# Patient Record
Sex: Female | Born: 1935 | Race: White | Hispanic: No | Smoking: Never smoker
Health system: Southern US, Community
[De-identification: ages and names within clinical notes are randomized; demographics above are authoritative.]

## PROBLEM LIST (undated history)

## (undated) DIAGNOSIS — K279 Peptic ulcer, site unspecified, unspecified as acute or chronic, without hemorrhage or perforation: Secondary | ICD-10-CM

## (undated) DIAGNOSIS — F0390 Unspecified dementia without behavioral disturbance: Secondary | ICD-10-CM

## (undated) DIAGNOSIS — I509 Heart failure, unspecified: Secondary | ICD-10-CM

## (undated) DIAGNOSIS — U071 COVID-19: Secondary | ICD-10-CM

## (undated) DIAGNOSIS — I10 Essential (primary) hypertension: Secondary | ICD-10-CM

## (undated) DIAGNOSIS — Z9861 Coronary angioplasty status: Secondary | ICD-10-CM

## (undated) DIAGNOSIS — I2510 Atherosclerotic heart disease of native coronary artery without angina pectoris: Secondary | ICD-10-CM

## (undated) DIAGNOSIS — E119 Type 2 diabetes mellitus without complications: Secondary | ICD-10-CM

## (undated) DIAGNOSIS — E785 Hyperlipidemia, unspecified: Secondary | ICD-10-CM

---

## 2019-10-27 ENCOUNTER — Inpatient Hospital Stay (HOSPITAL_COMMUNITY): Payer: Medicare HMO

## 2019-10-27 ENCOUNTER — Other Ambulatory Visit: Payer: Self-pay

## 2019-10-27 ENCOUNTER — Inpatient Hospital Stay (HOSPITAL_COMMUNITY)
Admission: EM | Admit: 2019-10-27 | Discharge: 2019-10-31 | DRG: 871 | Disposition: A | Discharge status: Skilled Nursing Facility | Payer: Medicare HMO | Source: Skilled Nursing Facility | Attending: Internal Medicine | Admitting: Internal Medicine

## 2019-10-27 ENCOUNTER — Emergency Department (HOSPITAL_COMMUNITY): Payer: Medicare HMO

## 2019-10-27 ENCOUNTER — Encounter (HOSPITAL_COMMUNITY): Payer: Self-pay

## 2019-10-27 DIAGNOSIS — R0602 Shortness of breath: Secondary | ICD-10-CM | POA: Diagnosis not present

## 2019-10-27 DIAGNOSIS — Z888 Allergy status to other drugs, medicaments and biological substances status: Secondary | ICD-10-CM

## 2019-10-27 DIAGNOSIS — J440 Chronic obstructive pulmonary disease with acute lower respiratory infection: Secondary | ICD-10-CM | POA: Diagnosis present

## 2019-10-27 DIAGNOSIS — E785 Hyperlipidemia, unspecified: Secondary | ICD-10-CM | POA: Diagnosis present

## 2019-10-27 DIAGNOSIS — I130 Hypertensive heart and chronic kidney disease with heart failure and stage 1 through stage 4 chronic kidney disease, or unspecified chronic kidney disease: Secondary | ICD-10-CM | POA: Diagnosis present

## 2019-10-27 DIAGNOSIS — J9601 Acute respiratory failure with hypoxia: Secondary | ICD-10-CM | POA: Diagnosis present

## 2019-10-27 DIAGNOSIS — Z7901 Long term (current) use of anticoagulants: Secondary | ICD-10-CM | POA: Diagnosis not present

## 2019-10-27 DIAGNOSIS — U071 COVID-19: Secondary | ICD-10-CM | POA: Diagnosis present

## 2019-10-27 DIAGNOSIS — J159 Unspecified bacterial pneumonia: Secondary | ICD-10-CM | POA: Diagnosis present

## 2019-10-27 DIAGNOSIS — F0390 Unspecified dementia without behavioral disturbance: Secondary | ICD-10-CM | POA: Diagnosis present

## 2019-10-27 DIAGNOSIS — E1165 Type 2 diabetes mellitus with hyperglycemia: Secondary | ICD-10-CM | POA: Diagnosis present

## 2019-10-27 DIAGNOSIS — Z7982 Long term (current) use of aspirin: Secondary | ICD-10-CM | POA: Diagnosis not present

## 2019-10-27 DIAGNOSIS — I10 Essential (primary) hypertension: Secondary | ICD-10-CM | POA: Diagnosis not present

## 2019-10-27 DIAGNOSIS — I5043 Acute on chronic combined systolic (congestive) and diastolic (congestive) heart failure: Secondary | ICD-10-CM

## 2019-10-27 DIAGNOSIS — Z79899 Other long term (current) drug therapy: Secondary | ICD-10-CM

## 2019-10-27 DIAGNOSIS — I959 Hypotension, unspecified: Secondary | ICD-10-CM | POA: Diagnosis present

## 2019-10-27 DIAGNOSIS — Z880 Allergy status to penicillin: Secondary | ICD-10-CM | POA: Diagnosis not present

## 2019-10-27 DIAGNOSIS — A4150 Gram-negative sepsis, unspecified: Secondary | ICD-10-CM | POA: Diagnosis not present

## 2019-10-27 DIAGNOSIS — Z8711 Personal history of peptic ulcer disease: Secondary | ICD-10-CM

## 2019-10-27 DIAGNOSIS — N184 Chronic kidney disease, stage 4 (severe): Secondary | ICD-10-CM | POA: Diagnosis present

## 2019-10-27 DIAGNOSIS — J1282 Pneumonia due to coronavirus disease 2019: Secondary | ICD-10-CM | POA: Diagnosis present

## 2019-10-27 DIAGNOSIS — E1159 Type 2 diabetes mellitus with other circulatory complications: Secondary | ICD-10-CM | POA: Diagnosis present

## 2019-10-27 DIAGNOSIS — I2510 Atherosclerotic heart disease of native coronary artery without angina pectoris: Secondary | ICD-10-CM | POA: Diagnosis present

## 2019-10-27 DIAGNOSIS — I4510 Unspecified right bundle-branch block: Secondary | ICD-10-CM | POA: Diagnosis present

## 2019-10-27 DIAGNOSIS — J189 Pneumonia, unspecified organism: Secondary | ICD-10-CM | POA: Diagnosis not present

## 2019-10-27 DIAGNOSIS — E1122 Type 2 diabetes mellitus with diabetic chronic kidney disease: Secondary | ICD-10-CM | POA: Diagnosis present

## 2019-10-27 DIAGNOSIS — Y95 Nosocomial condition: Secondary | ICD-10-CM | POA: Diagnosis not present

## 2019-10-27 DIAGNOSIS — Z66 Do not resuscitate: Secondary | ICD-10-CM | POA: Diagnosis present

## 2019-10-27 DIAGNOSIS — N179 Acute kidney failure, unspecified: Secondary | ICD-10-CM

## 2019-10-27 DIAGNOSIS — I152 Hypertension secondary to endocrine disorders: Secondary | ICD-10-CM | POA: Diagnosis present

## 2019-10-27 DIAGNOSIS — Z9861 Coronary angioplasty status: Secondary | ICD-10-CM

## 2019-10-27 DIAGNOSIS — E119 Type 2 diabetes mellitus without complications: Secondary | ICD-10-CM | POA: Diagnosis present

## 2019-10-27 DIAGNOSIS — R7881 Bacteremia: Secondary | ICD-10-CM | POA: Diagnosis not present

## 2019-10-27 HISTORY — DX: COVID-19: U07.1

## 2019-10-27 HISTORY — DX: Hyperlipidemia, unspecified: E78.5

## 2019-10-27 HISTORY — DX: Coronary angioplasty status: Z98.61

## 2019-10-27 HISTORY — DX: Unspecified dementia, unspecified severity, without behavioral disturbance, psychotic disturbance, mood disturbance, and anxiety: F03.90

## 2019-10-27 HISTORY — DX: Type 2 diabetes mellitus without complications: E11.9

## 2019-10-27 HISTORY — DX: Heart failure, unspecified: I50.9

## 2019-10-27 HISTORY — DX: Essential (primary) hypertension: I10

## 2019-10-27 HISTORY — DX: Peptic ulcer, site unspecified, unspecified as acute or chronic, without hemorrhage or perforation: K27.9

## 2019-10-27 HISTORY — DX: Atherosclerotic heart disease of native coronary artery without angina pectoris: I25.10

## 2019-10-27 LAB — RESPIRATORY PANEL BY RT PCR (FLU A&B, COVID)
Influenza A by PCR: NEGATIVE
Influenza B by PCR: NEGATIVE
SARS Coronavirus 2 by RT PCR: POSITIVE — AB

## 2019-10-27 LAB — COMPREHENSIVE METABOLIC PANEL
ALT: 20 U/L (ref 0–44)
AST: 21 U/L (ref 15–41)
Albumin: 2.3 g/dL — ABNORMAL LOW (ref 3.5–5.0)
Alkaline Phosphatase: 77 U/L (ref 38–126)
Anion gap: 13 (ref 5–15)
BUN: 72 mg/dL — ABNORMAL HIGH (ref 8–23)
CO2: 22 mmol/L (ref 22–32)
Calcium: 9.5 mg/dL (ref 8.9–10.3)
Chloride: 102 mmol/L (ref 98–111)
Creatinine, Ser: 3.05 mg/dL — ABNORMAL HIGH (ref 0.44–1.00)
GFR calc Af Amer: 15 mL/min — ABNORMAL LOW (ref >60)
GFR calc non Af Amer: 13 mL/min — ABNORMAL LOW (ref >60)
Glucose, Bld: 376 mg/dL — ABNORMAL HIGH (ref 70–99)
Potassium: 4.8 mmol/L (ref 3.5–5.1)
Sodium: 137 mmol/L (ref 135–145)
Total Bilirubin: 0.9 mg/dL (ref 0.3–1.2)
Total Protein: 5.1 g/dL — ABNORMAL LOW (ref 6.5–8.1)

## 2019-10-27 LAB — TROPONIN I (HIGH SENSITIVITY)
Troponin I (High Sensitivity): 58 ng/L — ABNORMAL HIGH (ref <18)
Troponin I (High Sensitivity): 53 ng/L — ABNORMAL HIGH (ref <18)

## 2019-10-27 LAB — CBC WITH DIFFERENTIAL/PLATELET
Abs Immature Granulocytes: 0 10*3/uL (ref 0.00–0.07)
Basophils Absolute: 0 10*3/uL (ref 0.0–0.1)
Basophils Relative: 0 %
Eosinophils Absolute: 0 10*3/uL (ref 0.0–0.5)
Eosinophils Relative: 0 %
HCT: 39.6 % (ref 36.0–46.0)
Hemoglobin: 12.7 g/dL (ref 12.0–15.0)
Lymphocytes Relative: 1 %
Lymphs Abs: 0.5 10*3/uL — ABNORMAL LOW (ref 0.7–4.0)
MCH: 31.1 pg (ref 26.0–34.0)
MCHC: 32.1 g/dL (ref 30.0–36.0)
MCV: 97.1 fL (ref 80.0–100.0)
Monocytes Absolute: 1.1 10*3/uL — ABNORMAL HIGH (ref 0.1–1.0)
Monocytes Relative: 2 %
Neutro Abs: 51.9 10*3/uL — ABNORMAL HIGH (ref 1.7–7.7)
Neutrophils Relative %: 97 %
Platelets: 333 10*3/uL (ref 150–400)
RBC: 4.08 MIL/uL (ref 3.87–5.11)
RDW: 13 % (ref 11.5–15.5)
WBC: 53.5 10*3/uL (ref 4.0–10.5)
nRBC: 0 % (ref 0.0–0.2)
nRBC: 0 /100 WBC

## 2019-10-27 LAB — LACTATE DEHYDROGENASE: LDH: 218 U/L — ABNORMAL HIGH (ref 98–192)

## 2019-10-27 LAB — ECHOCARDIOGRAM LIMITED
Height: 66 in
Weight: 2816 oz

## 2019-10-27 LAB — CBG MONITORING, ED
Glucose-Capillary: 377 mg/dL — ABNORMAL HIGH (ref 70–99)
Glucose-Capillary: 245 mg/dL — ABNORMAL HIGH (ref 70–99)
Glucose-Capillary: 117 mg/dL — ABNORMAL HIGH (ref 70–99)

## 2019-10-27 LAB — TRIGLYCERIDES: Triglycerides: 311 mg/dL — ABNORMAL HIGH (ref <150)

## 2019-10-27 LAB — PROCALCITONIN: Procalcitonin: 68.85 ng/mL

## 2019-10-27 LAB — FERRITIN: Ferritin: 375 ng/mL — ABNORMAL HIGH (ref 11–307)

## 2019-10-27 LAB — ABO/RH: ABO/RH(D): A POS

## 2019-10-27 LAB — FIBRINOGEN: Fibrinogen: 395 mg/dL (ref 210–475)

## 2019-10-27 LAB — PROTIME-INR
INR: 1.6 — ABNORMAL HIGH (ref 0.8–1.2)
Prothrombin Time: 19.1 seconds — ABNORMAL HIGH (ref 11.4–15.2)

## 2019-10-27 LAB — APTT: aPTT: 31 seconds (ref 24–36)

## 2019-10-27 LAB — C-REACTIVE PROTEIN: CRP: 14.1 mg/dL — ABNORMAL HIGH (ref <1.0)

## 2019-10-27 LAB — LACTIC ACID, PLASMA
Lactic Acid, Venous: 5.5 mmol/L (ref 0.5–1.9)
Lactic Acid, Venous: 4.5 mmol/L (ref 0.5–1.9)

## 2019-10-27 LAB — D-DIMER, QUANTITATIVE: D-Dimer, Quant: 1.69 ug/mL-FEU — ABNORMAL HIGH (ref 0.00–0.50)

## 2019-10-27 MED ORDER — SODIUM CHLORIDE 0.9 % IV BOLUS (SEPSIS)
500.0000 mL | Freq: Once | INTRAVENOUS | Status: AC
  Administered 2019-10-27: 500 mL via INTRAVENOUS

## 2019-10-27 MED ORDER — SODIUM CHLORIDE 0.9 % IV SOLN
200.0000 mg | Freq: Once | INTRAVENOUS | Status: AC
  Administered 2019-10-27: 17:00:00 200 mg via INTRAVENOUS
  Filled 2019-10-27: qty 40

## 2019-10-27 MED ORDER — MIRTAZAPINE 15 MG PO TABS
7.5000 mg | ORAL_TABLET | Freq: Every day | ORAL | Status: DC
  Administered 2019-10-27 – 2019-10-30 (×4): 7.5 mg via ORAL
  Filled 2019-10-27 (×5): qty 1

## 2019-10-27 MED ORDER — VANCOMYCIN HCL 1500 MG/300ML IV SOLN
1500.0000 mg | Freq: Once | INTRAVENOUS | Status: AC
  Administered 2019-10-27: 1500 mg via INTRAVENOUS
  Filled 2019-10-27: qty 300

## 2019-10-27 MED ORDER — SODIUM CHLORIDE 0.45 % IV SOLN: INTRAVENOUS | Status: DC

## 2019-10-27 MED ORDER — DOCUSATE SODIUM 100 MG PO CAPS
100.0000 mg | ORAL_CAPSULE | Freq: Two times a day (BID) | ORAL | Status: DC
  Administered 2019-10-27 – 2019-10-30 (×5): 100 mg via ORAL
  Filled 2019-10-27 (×6): qty 1

## 2019-10-27 MED ORDER — TECHNETIUM TO 99M ALBUMIN AGGREGATED
1.5400 | Freq: Once | INTRAVENOUS | Status: AC | PRN
  Administered 2019-10-27: 16:00:00 1.54 via INTRAVENOUS

## 2019-10-27 MED ORDER — AMLODIPINE BESYLATE 5 MG PO TABS
2.5000 mg | ORAL_TABLET | Freq: Every day | ORAL | Status: DC
  Administered 2019-10-28: 09:00:00 2.5 mg via ORAL
  Filled 2019-10-27 (×2): qty 1

## 2019-10-27 MED ORDER — ACETAMINOPHEN 325 MG PO TABS
650.0000 mg | ORAL_TABLET | Freq: Four times a day (QID) | ORAL | Status: DC | PRN
  Administered 2019-10-28: 650 mg via ORAL
  Filled 2019-10-27: qty 2

## 2019-10-27 MED ORDER — CARVEDILOL 12.5 MG PO TABS: 12.5000 mg | ORAL_TABLET | Freq: Two times a day (BID) | ORAL | Status: DC

## 2019-10-27 MED ORDER — HEPARIN SODIUM (PORCINE) 5000 UNIT/ML IJ SOLN
5000.0000 [IU] | Freq: Three times a day (TID) | INTRAMUSCULAR | Status: DC
  Administered 2019-10-27 – 2019-10-29 (×6): 5000 [IU] via SUBCUTANEOUS
  Filled 2019-10-27 (×5): qty 1

## 2019-10-27 MED ORDER — SODIUM CHLORIDE 0.9 % IV BOLUS (SEPSIS)
250.0000 mL | Freq: Once | INTRAVENOUS | Status: AC
  Administered 2019-10-27: 250 mL via INTRAVENOUS

## 2019-10-27 MED ORDER — INSULIN DETEMIR 100 UNIT/ML ~~LOC~~ SOLN
0.0750 [IU]/kg | Freq: Two times a day (BID) | SUBCUTANEOUS | Status: DC
  Administered 2019-10-27 – 2019-10-31 (×8): 6 [IU] via SUBCUTANEOUS
  Filled 2019-10-27 (×13): qty 0.06

## 2019-10-27 MED ORDER — ENOXAPARIN SODIUM 30 MG/0.3ML ~~LOC~~ SOLN: 30.0000 mg | SUBCUTANEOUS | Status: DC

## 2019-10-27 MED ORDER — FESOTERODINE FUMARATE ER 4 MG PO TB24
4.0000 mg | ORAL_TABLET | Freq: Every day | ORAL | Status: DC
  Administered 2019-10-28 – 2019-10-31 (×4): 4 mg via ORAL
  Filled 2019-10-27 (×5): qty 1

## 2019-10-27 MED ORDER — ENOXAPARIN SODIUM 30 MG/0.3ML ~~LOC~~ SOLN: 30.0000 mg | Freq: Once | SUBCUTANEOUS | Status: DC

## 2019-10-27 MED ORDER — DEXAMETHASONE 6 MG PO TABS
6.0000 mg | ORAL_TABLET | ORAL | Status: DC
  Administered 2019-10-27 – 2019-10-30 (×4): 6 mg via ORAL
  Filled 2019-10-27 (×2): qty 1
  Filled 2019-10-27: qty 2
  Filled 2019-10-27: qty 1

## 2019-10-27 MED ORDER — SODIUM CHLORIDE 0.9 % IV BOLUS
500.0000 mL | Freq: Once | INTRAVENOUS | Status: AC
  Administered 2019-10-27: 500 mL via INTRAVENOUS

## 2019-10-27 MED ORDER — SODIUM CHLORIDE 0.9 % IV SOLN
100.0000 mg | Freq: Every day | INTRAVENOUS | Status: AC
  Administered 2019-10-28 – 2019-10-31 (×4): 100 mg via INTRAVENOUS
  Filled 2019-10-27 (×6): qty 20

## 2019-10-27 MED ORDER — HALOPERIDOL 1 MG PO TABS
1.0000 mg | ORAL_TABLET | Freq: Three times a day (TID) | ORAL | Status: DC
  Administered 2019-10-27 – 2019-10-31 (×11): 1 mg via ORAL
  Filled 2019-10-27 (×15): qty 1

## 2019-10-27 MED ORDER — SODIUM CHLORIDE 0.9 % IV BOLUS (SEPSIS)
1000.0000 mL | Freq: Once | INTRAVENOUS | Status: AC
  Administered 2019-10-27: 11:00:00 1000 mL via INTRAVENOUS

## 2019-10-27 MED ORDER — INSULIN ASPART 100 UNIT/ML ~~LOC~~ SOLN
0.0000 [IU] | SUBCUTANEOUS | Status: DC
  Administered 2019-10-27: 16:00:00 7 [IU] via SUBCUTANEOUS
  Administered 2019-10-27: 3 [IU] via SUBCUTANEOUS
  Administered 2019-10-28 (×2): 4 [IU] via SUBCUTANEOUS
  Administered 2019-10-28 (×2): 3 [IU] via SUBCUTANEOUS
  Administered 2019-10-28: 13:00:00 15 [IU] via SUBCUTANEOUS
  Administered 2019-10-29: 3 [IU] via SUBCUTANEOUS
  Administered 2019-10-29: 4 [IU] via SUBCUTANEOUS
  Administered 2019-10-29: 7 [IU] via SUBCUTANEOUS
  Administered 2019-10-29: 11 [IU] via SUBCUTANEOUS
  Administered 2019-10-29 (×2): 4 [IU] via SUBCUTANEOUS
  Administered 2019-10-30: 15 [IU] via SUBCUTANEOUS
  Administered 2019-10-30 (×2): 7 [IU] via SUBCUTANEOUS
  Administered 2019-10-30: 01:00:00 4 [IU] via SUBCUTANEOUS
  Administered 2019-10-30: 05:00:00 3 [IU] via SUBCUTANEOUS
  Administered 2019-10-30: 4 [IU] via SUBCUTANEOUS
  Administered 2019-10-31: 3 [IU] via SUBCUTANEOUS

## 2019-10-27 MED ORDER — ACETAMINOPHEN 650 MG RE SUPP
650.0000 mg | Freq: Once | RECTAL | Status: AC
  Administered 2019-10-27: 650 mg via RECTAL
  Filled 2019-10-27: qty 1

## 2019-10-27 MED ORDER — SODIUM CHLORIDE 0.9 % IV SOLN
500.0000 mg | Freq: Two times a day (BID) | INTRAVENOUS | Status: DC
  Administered 2019-10-27 – 2019-10-28 (×2): 500 mg via INTRAVENOUS
  Filled 2019-10-27 (×4): qty 0.5

## 2019-10-27 MED ORDER — VANCOMYCIN VARIABLE DOSE PER UNSTABLE RENAL FUNCTION (PHARMACIST DOSING): Status: DC

## 2019-10-27 MED ORDER — LEVOFLOXACIN IN D5W 500 MG/100ML IV SOLN: 500.0000 mg | INTRAVENOUS | Status: DC

## 2019-10-27 MED ORDER — FUROSEMIDE 20 MG PO TABS
20.0000 mg | ORAL_TABLET | Freq: Every day | ORAL | Status: DC
  Administered 2019-10-27: 20 mg via ORAL
  Filled 2019-10-27: qty 1

## 2019-10-27 MED ORDER — LEVOFLOXACIN IN D5W 750 MG/150ML IV SOLN
750.0000 mg | Freq: Once | INTRAVENOUS | Status: AC
  Administered 2019-10-27: 750 mg via INTRAVENOUS
  Filled 2019-10-27: qty 150

## 2019-10-27 NOTE — ED Notes (Signed)
[facility] from [facility]

## 2019-10-27 NOTE — ED Provider Notes (Signed)
[facility] EMERGENCY DEPARTMENT Provider Note   CSN: [id number] Arrival date & time: 10/27/19  1008     History Chief Complaint  Patient presents with  . Shortness of Breath  . Hypotension    Chloe Ramsey is a 86 y.o. female.  HPI   86 year old female with a h/o CAD, CHF, dementia, HLD, HTN, PUD, presenting for evaluation via EMS from [facility] for evaluation of hypoxia, hypotension and hyperglycemia.  Per facility, patient was diagnosed with the coronavirus on 12/23.  She had not been requiring oxygen up until today.  Per facility she had sats in the upper 60s, low 70s on 5 L O2.  Upon EMS arrival she was placed on the nonrebreather and sats improved to the 90s. She was given 700cc IVF en route.  Level 5 caveat due to patient h/o dementia and acuity of condition.   11:39 AM Discussed case with supervisor from [facility] who states that patient does have h/o dementia and can normally talk and state what her name is. Further states that pt can normally walk on her own. She has intermittently been on oxygen. Today her sats dropped to 76% this AM. Her BP was 91/51. Denies that pt has had any recent diarrhea, vomiting.  Past Medical History:  Diagnosis Date  . CAD (coronary artery disease)   . CHF (congestive heart failure) ([place])   . COVID-19   . Dementia ([place])   . Diabetes mellitus without complication ([place])   . H/O coronary angioplasty   . Hyperlipidemia   . Hypertension   . Peptic ulcer     Patient Active Problem List   Diagnosis Date Noted  . Type 2 diabetes mellitus with stage 4 chronic kidney disease ([place]) 10/27/2019  . Hypertension complicating diabetes ([place]) 10/27/2019  . Acute on chronic combined systolic and diastolic CHF (congestive heart failure) ([place]) 10/27/2019  . Pneumonia due to COVID-19 virus 10/27/2019  . HAP (hospital-acquired pneumonia) 10/27/2019    History reviewed. No pertinent surgical history.   OB History   No obstetric  history on file.     History reviewed. No pertinent family history.  Social History   Tobacco Use  . Smoking status: Unknown If Ever Smoked  . Smokeless tobacco: Never Used  Substance Use Topics  . Alcohol use: Not Currently  . Drug use: Not Currently    Home Medications Prior to Admission medications   Medication Sig Start Date End Date Taking? Authorizing Provider  acetaminophen (TYLENOL) 500 MG tablet Take 1,000 mg by mouth daily.   Yes [provider]  amLODipine (NORVASC) 2.5 MG tablet Take 2.5 mg by mouth daily.   Yes [provider]  apixaban (ELIQUIS) 2.5 MG TABS tablet Take 2.5 mg by mouth 2 (two) times daily. For 21 days, started 10-10-19   Yes [provider]  aspirin 81 MG chewable tablet Chew 81 mg by mouth daily.   Yes [provider]  atorvastatin (LIPITOR) 40 MG tablet Take 40 mg by mouth at bedtime.   Yes [provider]  calcium citrate (CALCITRATE - DOSED IN MG ELEMENTAL CALCIUM) 950 (200 Ca) MG tablet Take 200 mg of elemental calcium by mouth daily.   Yes [provider]  carvedilol (COREG) 12.5 MG tablet Take 12.5 mg by mouth 2 (two) times daily with a meal.   Yes [provider]  Cholecalciferol (VITAMIN D) 50 MCG (2000 UT) CAPS Take 2,000 Units by mouth daily.   Yes [provider]  donepezil (ARICEPT) 10 MG tablet Take 10 mg by mouth at bedtime.   Yes [provider]  furosemide (LASIX) 20 MG tablet Take 20 mg by mouth daily.   Yes [provider]  haloperidol (HALDOL) 1 MG tablet Take 1 mg by mouth 3 (three) times daily.   Yes [provider]  hydrochlorothiazide (MICROZIDE) 12.5 MG capsule Take 12.5 mg by mouth daily.   Yes [provider]  mirtazapine (REMERON) 7.5 MG tablet Take 7.5 mg by mouth at bedtime.   Yes [provider]  tolterodine (DETROL LA) 4 MG 24 hr capsule Take 4 mg by mouth daily.   Yes [provider]  valsartan  (DIOVAN) 320 MG tablet Take 320 mg by mouth daily.   Yes [provider]    Allergies    Lopid [gemfibrozil] and Penicillins  Review of Systems   Review of Systems  Unable to perform ROS: Dementia    Physical Exam Updated Vital Signs BP (!) 95/44   Pulse 90   Temp (!) 100.7 F (38.2 C) (Rectal)   Resp (!) 39   Ht 5\' 6"  (1.676 m)   Wt 79.8 kg   LMP  (LMP Unknown)   SpO2 99%   BMI 28.41 kg/m   Physical Exam Vitals and nursing note reviewed.  Constitutional:      General: She is not in acute distress.    Appearance: She is well-developed. She is ill-appearing.  HENT:     Head: Normocephalic and atraumatic.     Mouth/Throat:     Mouth: Mucous membranes are dry.  Eyes:     Conjunctiva/sclera: Conjunctivae normal.  Cardiovascular:     Rate and Rhythm: Normal rate and regular rhythm.     Heart sounds: No murmur.  Pulmonary:     Effort: Pulmonary effort is normal. No respiratory distress.     Breath sounds: Examination of the right-upper field reveals rales. Examination of the left-upper field reveals rales. Examination of the right-middle field reveals rales. Examination of the left-middle field reveals rales. Examination of the right-lower field reveals rales. Examination of the left-lower field reveals rales. Rales present.  Abdominal:     General: Bowel sounds are normal.     Palpations: Abdomen is soft.     Tenderness: There is no abdominal tenderness. There is no guarding or rebound.  Musculoskeletal:     Cervical back: Neck supple.     Right lower leg: No tenderness. No edema.     Left lower leg: No tenderness. No edema.  Skin:    General: Skin is warm and dry.  Neurological:     Mental Status: She is alert.     ED Results / Procedures / Treatments   Labs (all labs ordered are listed, but only abnormal results are displayed) Labs Reviewed  RESPIRATORY PANEL BY RT PCR (FLU A&B, COVID) - Abnormal; Notable for the following components:      Result  Value   SARS Coronavirus 2 by RT PCR POSITIVE (*)    All other components within normal limits  LACTIC ACID, PLASMA - Abnormal; Notable for the following components:   Lactic Acid, Venous 5.5 (*)    All other components within normal limits  LACTIC ACID, PLASMA - Abnormal; Notable for the following components:   Lactic Acid, Venous 4.5 (*)    All other components within normal limits  CBC WITH DIFFERENTIAL/PLATELET - Abnormal; Notable for the following components:   WBC 53.5 (*)  Neutro Abs 51.9 (*)    Lymphs Abs 0.5 (*)    Monocytes Absolute 1.1 (*)    All other components within normal limits  COMPREHENSIVE METABOLIC PANEL - Abnormal; Notable for the following components:   Glucose, Bld 376 (*)    BUN 72 (*)    Creatinine, Ser 3.05 (*)    Total Protein 5.1 (*)    Albumin 2.3 (*)    GFR calc non Af Amer 13 (*)    GFR calc Af Amer 15 (*)    All other components within normal limits  D-DIMER, QUANTITATIVE (NOT AT [facility]) - Abnormal; Notable for the following components:   D-Dimer, Quant 1.69 (*)    All other components within normal limits  LACTATE DEHYDROGENASE - Abnormal; Notable for the following components:   LDH 218 (*)    All other components within normal limits  FERRITIN - Abnormal; Notable for the following components:   Ferritin 375 (*)    All other components within normal limits  TRIGLYCERIDES - Abnormal; Notable for the following components:   Triglycerides 311 (*)    All other components within normal limits  C-REACTIVE PROTEIN - Abnormal; Notable for the following components:   CRP 14.1 (*)    All other components within normal limits  PROTIME-INR - Abnormal; Notable for the following components:   Prothrombin Time 19.1 (*)    INR 1.6 (*)    All other components within normal limits  CBG MONITORING, ED - Abnormal; Notable for the following components:   Glucose-Capillary 377 (*)    All other components within normal limits  TROPONIN I (HIGH SENSITIVITY) -  Abnormal; Notable for the following components:   Troponin I (High Sensitivity) 58 (*)    All other components within normal limits  TROPONIN I (HIGH SENSITIVITY) - Abnormal; Notable for the following components:   Troponin I (High Sensitivity) 53 (*)    All other components within normal limits  CULTURE, BLOOD (ROUTINE X 2)  CULTURE, BLOOD (ROUTINE X 2)  URINE CULTURE  PROCALCITONIN  FIBRINOGEN  APTT  URINALYSIS, ROUTINE W REFLEX MICROSCOPIC  POC SARS CORONAVIRUS 2 AG -  ED    EKG EKG Interpretation  Date/Time:  Tuesday October 27 2019 13:15:05 EST Ventricular Rate:  98 PR Interval:    QRS Duration: 139 QT Interval:  432 QTC Calculation: 505 R Axis:   19 Text Interpretation: Sinus rhythm Ventricular premature complex Consider left atrial enlargement Right bundle branch block Confirmed by Veryl Speak [phone]) on 10/27/2019 1:19:18 PM   Radiology DG Chest Port 1 View  Result Date: 10/27/2019 CLINICAL DATA:  COVID positive.  Hypoxia EXAM: PORTABLE CHEST 1 VIEW COMPARISON:  None FINDINGS: Bibasilar infiltrates compatible with pneumonia. Negative for heart failure or effusion. Atherosclerotic aorta. Chronic appearing rib fractures on the right. Degenerative change in the right shoulder. IMPRESSION: Bibasilar infiltrates consistent with pneumonia Electronically Signed   By: Franchot Gallo M.D.   On: 10/27/2019 11:07    Procedures Procedures (including critical care time) CRITICAL CARE Performed by: Rodney Booze   Total critical care time: 45 minutes  Critical care time was exclusive of separately billable procedures and treating other patients.  Critical care was necessary to treat or prevent imminent or life-threatening deterioration.  Critical care was time spent personally by me on the following activities: development of treatment plan with patient and/or surrogate as well as nursing, discussions with consultants, evaluation of patient's response to treatment,  examination of patient, obtaining history from patient  or surrogate, ordering and performing treatments and interventions, ordering and review of laboratory studies, ordering and review of radiographic studies, pulse oximetry and re-evaluation of patient's condition.   Medications Ordered in ED Medications  levofloxacin (LEVAQUIN) IVPB 500 mg (has no administration in time range)  acetaminophen (TYLENOL) suppository 650 mg (has no administration in time range)  enoxaparin (LOVENOX) injection 30 mg (has no administration in time range)  sodium chloride 0.9 % bolus 1,000 mL (0 mLs Intravenous Stopped 10/27/19 1120)    And  sodium chloride 0.9 % bolus 500 mL (0 mLs Intravenous Stopped 10/27/19 1219)    And  sodium chloride 0.9 % bolus 250 mL (0 mLs Intravenous Stopped 10/27/19 1219)  levofloxacin (LEVAQUIN) IVPB 750 mg (0 mg Intravenous Stopped 10/27/19 1317)  sodium chloride 0.9 % bolus 500 mL (500 mLs Intravenous New Bag/Given 10/27/19 1317)    ED Course  I have reviewed the triage vital signs and the nursing notes.  Pertinent labs & imaging results that were available during my care of the patient were reviewed by me and considered in my medical decision making (see chart for details).    MDM Rules/Calculators/A&P                      86 year old female, recently diagnosed with Covid, presenting for evaluation of hypotension, hypoxia, hyperglycemia. Pt DNR with comfort measures, fluids and antibiotics only at bedside   On arrival, patient hypotensive, febrile to 100.7 with increased respiratory rate satting at high 90s to 100% on nonrebreather.  No tachycardia noted.  Based on vital signs and suspected upper respiratory infection, code sepsis initiated and antibiotics initiated for this.  30 cc/kg bolus was ordered. She has already received 700cc fluid bolus with EMS PTA.   CBC with leukocytosis of 53,000 CMP  With elevated blood glucose at 376, normal bicarb.  No elevated anion gap.   BUN/creatinine are elevated at 72 and 3.05. No prior labs for comparison. Lactic elevated at 5.5 Coags with elevated INR 1.6 Inflammatory markers marginally elevated Procalcitonin elevated Trop marginally elevated at 58, suspect secondary to demand UA pending on admissoin Blood cultures obtained  CXR with bibasilar infiltrates consistent with pneumonia   11:19 AM Reassessed pt. BP [id number] systolic. HR 80. RR 24, satting at 100% on NRB.   12:33 PM Had a long discussion with the patients son, Delfino Lovett regarding what interventions the patient would want to have, specifically, if they would want the patient to be initiated on pressors if her BP does not improve. He would like to discuss this with family and have me call him back.  12:56 PM Discussed case with the patients son, who does not with to pursue pressors or ICU admission. He would like to proceed with supportive measures including IVF, abx, and admission to the hospitalist service for further care.    1:48 PM CONSULT with Dr. Linda Hedges with hospitalist service who accepts patient for admission  Final Clinical Impression(s) / ED Diagnoses Final diagnoses:  [id number]    Rx / DC Orders ED Discharge Orders    None       Rodney Booze, PA-C 10/27/19 1416    Veryl Speak, MD 10/29/19 [phone]

## 2019-10-27 NOTE — Progress Notes (Signed)
Pharmacy Antibiotic Note  Chloe Ramsey is a 86 y.o. female admitted on 10/27/2019 with pneumonia and sepsis.  Pharmacy has been consulted for Vancomycin dosing.  Height: 5\' 6"  (167.6 cm) Weight: 176 lb (79.8 kg) IBW/kg (Calculated) : 59.3  Temp (24hrs), Avg:100.7 F (38.2 C), Min:100.7 F (38.2 C), Max:100.7 F (38.2 C)  Recent Labs  Lab 10/27/19 1036 10/27/19 1037 10/27/19 1310  WBC  --  53.5*  --   CREATININE  --  3.05*  --   LATICACIDVEN 5.5*  --  4.5*    Estimated Creatinine Clearance: 13.8 mL/min (A) (by C-G formula based on SCr of 3.05 mg/dL (H)).    Allergies  Allergen Reactions  . Lopid [Gemfibrozil]   . Penicillins     Antimicrobials this admission: 1/12 Levaquin  >> x 1 dose  1/12 Meropenem >>  1/12 Vancomycin  Dose adjustments this admission:   Microbiology results: 1/12 BCx: Pending  Plan: - Vancomycin 1500 mg IV x 1 dose  - D/c Levaquin 2/2 addition of meropenem, renal fxn, and Qtc - With patient's current renal function will dose by levels  - Monitor patient's renal function while on vancomycin   Thank you for allowing pharmacy to be a part of this patient's care.  Duanne Limerick PharmD. BCPS  10/27/2019 4:15 PM

## 2019-10-27 NOTE — ED Notes (Signed)
ED TO INPATIENT HANDOFF REPORT  ED Nurse Name and Phone #: Jori Moll [phone]  S Name/Age/Gender Chloe Ramsey 86 y.o. female Room/Bed: 031C/031C  Code Status   Code Status: DNR  Home/SNF/Other Nursing Home Patient oriented to: self Is this baseline? Yes   Triage Complete: Triage complete  Chief Complaint Pneumonia due to COVID-19 virus [U07.1, J12.82]  Triage Note Per [facility] pt from [facility], they were called for hypoxia, hypotension and hyperglycemic.  Pt usually ambulatory with a walker, was walking around yesterday with no concerns.  Covid + 10/07/2019  On their arrival 5L [place] sats 68-70%. They placed her on NRB sats increased to 90%  BS bilateral Diminished lower with upper Left rhonchi   BP 86/42 HR 90 RR 42 89% 15 L NRB   18 g LAC 700 NS bolus given  Pt DNR with comfort measures, fluids and antibiotics only at bedside     Allergies Allergies  Allergen Reactions  . Lopid [Gemfibrozil]   . Penicillins     Level of Care/Admitting Diagnosis ED Disposition    ED Disposition Condition Comment   Admit  Hospital Area: [place] [[id number]]  Level of Care: Med-Surg [16]  Covid Evaluation: Confirmed COVID Positive  Diagnosis: Pneumonia due to COVID-19 virus [id number]  Admitting Physician: Neena Rhymes [[id number]]  Attending Physician: Adella Hare E [[id number]]  Estimated length of stay: 5 - 7 days  Certification:: I certify this patient will need inpatient services for at least 2 midnights       B Medical/Surgery History Past Medical History:  Diagnosis Date  . CAD (coronary artery disease)   . CHF (congestive heart failure) ([place])   . COVID-19   . Dementia ([place])   . Diabetes mellitus without complication ([place])   . H/O coronary angioplasty   . Hyperlipidemia   . Hypertension   . Peptic ulcer    History reviewed. No pertinent surgical history.   A IV Location/Drains/Wounds Patient Lines/Drains/Airways Status   Active  Line/Drains/Airways    Name:   Placement date:   Placement time:   Site:   Days:   Peripheral IV 10/27/19 Left Antecubital   10/27/19    0941    Antecubital   less than 1   Peripheral IV 10/27/19 Right;Posterior Forearm   10/27/19    1055    Forearm   less than 1          Intake/Output Last 24 hours  Intake/Output Summary (Last 24 hours) at 10/27/2019 1928 Last data filed at 10/27/2019 1910 Gross per 24 hour  Intake 2900 ml  Output -  Net 2900 ml    Labs/Imaging Results for orders placed or performed during the hospital encounter of 10/27/19 (from the past 48 hour(s))  CBG monitoring, ED     Status: Abnormal   Collection Time: 10/27/19 10:16 AM  Result Value Ref Range   Glucose-Capillary 377 (H) 70 - 99 mg/dL   Comment 1 Notify RN    Comment 2 Document in Chart   ABO/Rh     Status: None   Collection Time: 10/27/19 10:30 AM  Result Value Ref Range   ABO/RH(D)      A POS Performed at [facility], [place] [address], [place], [place] [address]   Lactic acid, plasma     Status: Abnormal   Collection Time: 10/27/19 10:36 AM  Result Value Ref Range   Lactic Acid, Venous 5.5 (HH) 0.5 - 1.9 mmol/L  Comment: CRITICAL RESULT CALLED TO, READ BACK BY AND VERIFIED WITH: SHULAR,L RN @ [id number] 10/27/19 LEONARD,A Performed at [facility], [place] [address] [place], [place] [address]   CBC WITH DIFFERENTIAL     Status: Abnormal   Collection Time: 10/27/19 10:37 AM  Result Value Ref Range   WBC 53.5 (HH) 4.0 - 10.5 K/uL    Comment: This critical result has verified and been called to [facility] RN by Elaina Pattee on 01 12 2021 at 1208, and has been read back.  REPEATED TO VERIFY CORRECTED ON 01/12 AT 1317: PREVIOUSLY REPORTED AS 53.5 This critical result has verified and been called to [place] by Elaina Pattee on 01 12 2021 at 1208, and has been read back.     RBC 4.08 3.87 - 5.11 MIL/uL   Hemoglobin 12.7 12.0 - 15.0 g/dL   HCT 39.6 36.0 - 46.0 %   MCV 97.1  80.0 - 100.0 fL   MCH 31.1 26.0 - 34.0 pg   MCHC 32.1 30.0 - 36.0 g/dL   RDW 13.0 11.5 - 15.5 %   Platelets 333 150 - 400 K/uL   nRBC 0.0 0.0 - 0.2 %   Neutrophils Relative % 97 %   Neutro Abs 51.9 (H) 1.7 - 7.7 K/uL   Lymphocytes Relative 1 %   Lymphs Abs 0.5 (L) 0.7 - 4.0 K/uL   Monocytes Relative 2 %   Monocytes Absolute 1.1 (H) 0.1 - 1.0 K/uL   Eosinophils Relative 0 %   Eosinophils Absolute 0.0 0.0 - 0.5 K/uL   Basophils Relative 0 %   Basophils Absolute 0.0 0.0 - 0.1 K/uL   WBC Morphology See Note     Comment: Increased Bands. >20% Bands   nRBC 0 0 /100 WBC   Abs Immature Granulocytes 0.00 0.00 - 0.07 K/uL    Comment: Performed at [facility], [place] [address] [place], [place] [address]  Comprehensive metabolic panel     Status: Abnormal   Collection Time: 10/27/19 10:37 AM  Result Value Ref Range   Sodium 137 135 - 145 mmol/L   Potassium 4.8 3.5 - 5.1 mmol/L   Chloride 102 98 - 111 mmol/L   CO2 22 22 - 32 mmol/L   Glucose, Bld 376 (H) 70 - 99 mg/dL   BUN 72 (H) 8 - 23 mg/dL   Creatinine, Ser 3.05 (H) 0.44 - 1.00 mg/dL   Calcium 9.5 8.9 - 10.3 mg/dL   Total Protein 5.1 (L) 6.5 - 8.1 g/dL   Albumin 2.3 (L) 3.5 - 5.0 g/dL   AST 21 15 - 41 U/L   ALT 20 0 - 44 U/L   Alkaline Phosphatase 77 38 - 126 U/L   Total Bilirubin 0.9 0.3 - 1.2 mg/dL   GFR calc non Af Amer 13 (L) >60 mL/min   GFR calc Af Amer 15 (L) >60 mL/min   Anion gap 13 5 - 15    Comment: Performed at [facility], [place] [address] [place], [place] [address]  D-dimer, quantitative     Status: Abnormal   Collection Time: 10/27/19 10:37 AM  Result Value Ref Range   D-Dimer, Quant 1.69 (H) 0.00 - 0.50 ug/mL-FEU    Comment: (NOTE) At the manufacturer cut-off of 0.50 ug/mL FEU, this assay has been documented to exclude PE with a sensitivity and negative predictive value of 97 to 99%.  At this time, this assay has not been approved by the FDA  to exclude DVT/VTE. Results should be correlated with  clinical presentation. Performed at [facility], [place] [address], [place], [place] [address]   Procalcitonin     Status: None   Collection Time: 10/27/19 10:37 AM  Result Value Ref Range   Procalcitonin 68.85 ng/mL    Comment:        Interpretation: PCT >= 10 ng/mL: Important systemic inflammatory response, almost exclusively due to severe bacterial sepsis or septic shock. (NOTE)       Sepsis PCT Algorithm           Lower Respiratory Tract                                      Infection PCT Algorithm    ----------------------------     ----------------------------         PCT < 0.25 ng/mL                PCT < 0.10 ng/mL         Strongly encourage             Strongly discourage   discontinuation of antibiotics    initiation of antibiotics    ----------------------------     -----------------------------       PCT 0.25 - 0.50 ng/mL            PCT 0.10 - 0.25 ng/mL               OR       >80% decrease in PCT            Discourage initiation of                                            antibiotics      Encourage discontinuation           of antibiotics    ----------------------------     -----------------------------         PCT >= 0.50 ng/mL              PCT 0.26 - 0.50 ng/mL                AND       <80% decrease in PCT             Encourage initiation of                                             antibiotics       Encourage continuation           of antibiotics    ----------------------------     -----------------------------        PCT >= 0.50 ng/mL                  PCT > 0.50 ng/mL               AND         increase in PCT                  Strongly encourage  initiation of antibiotics    Strongly encourage escalation           of antibiotics                                     -----------------------------                                           PCT <= 0.25 ng/mL                                                 OR                                         > 80% decrease in PCT                                     Discontinue / Do not initiate                                             antibiotics Performed at [facility], [address], [place], [place] [address]   Lactate dehydrogenase     Status: Abnormal   Collection Time: 10/27/19 10:37 AM  Result Value Ref Range   LDH 218 (H) 98 - 192 U/L    Comment: Performed at [place] [address] [place], [place] [address]  Ferritin     Status: Abnormal   Collection Time: 10/27/19 10:37 AM  Result Value Ref Range   Ferritin 375 (H) 11 - 307 ng/mL    Comment: Performed at [facility], [place] [address] [place], [place] [address]  Fibrinogen     Status: None   Collection Time: 10/27/19 10:37 AM  Result Value Ref Range   Fibrinogen 395 210 - 475 mg/dL    Comment: Performed at [facility] [address] [place], [place] [address]  C-reactive protein     Status: Abnormal   Collection Time: 10/27/19 10:37 AM  Result Value Ref Range   CRP 14.1 (H) <1.0 mg/dL    Comment: Performed at [facility] [address] [place], [place] [address]  Troponin I (High Sensitivity)     Status: Abnormal   Collection Time: 10/27/19 10:37 AM  Result Value Ref Range   Troponin I (High Sensitivity) 58 (H) <18 ng/L    Comment: (NOTE) Elevated high sensitivity troponin I (hsTnI) values and significant  changes across serial measurements may suggest ACS but many other  chronic and acute conditions are known to elevate hsTnI results.  Refer to the "Links" section for chest pain algorithms and additional  guidance. Performed at [facility], [place] [address] [place], [place] [address]   APTT     Status: None   Collection Time: 10/27/19 10:37 AM  Result Value Ref Range  aPTT 31 24 - 36 seconds    Comment: Performed at [facility], [place] [address], [place], [place] [address]  Protime-INR     Status: Abnormal   Collection Time:  10/27/19 10:37 AM  Result Value Ref Range   Prothrombin Time 19.1 (H) 11.4 - 15.2 seconds   INR 1.6 (H) 0.8 - 1.2    Comment: (NOTE) INR goal varies based on device and disease states. Performed at [facility], [place] [address], [place], [place] [address]   Triglycerides     Status: Abnormal   Collection Time: 10/27/19 10:50 AM  Result Value Ref Range   Triglycerides 311 (H) <150 mg/dL    Comment: Performed at [facility] [address] [place], [place] [address]  Respiratory Panel by RT PCR (Flu A&B, Covid) - Nasopharyngeal Swab     Status: Abnormal   Collection Time: 10/27/19 11:43 AM   Specimen: Nasopharyngeal Swab  Result Value Ref Range   SARS Coronavirus 2 by RT PCR POSITIVE (A) NEGATIVE    Comment: RESULT CALLED TO, READ BACK BY AND VERIFIED WITH: Alesia Banda RN 13:25 10/27/19 ([email or web address]) (NOTE) SARS-CoV-2 target nucleic acids are DETECTED. SARS-CoV-2 RNA is generally detectable in upper respiratory specimens  during the acute phase of infection. Positive results are indicative of the presence of the identified virus, but do not rule out bacterial infection or co-infection with other pathogens not detected by the test. Clinical correlation with patient history and other diagnostic information is necessary to determine patient infection status. The expected result is Negative. Fact Sheet for Patients:  PinkCheek.be Fact Sheet for Healthcare Providers: GravelBags.it This test is not yet approved or cleared by the [place] FDA and  has been authorized for detection and/or diagnosis of SARS-CoV-2 by FDA under an Emergency Use Authorization (EUA).  This EUA will remain in effect (meaning this test can be used)  for the duration of  the COVID-19 declaration under Section 564(b)(1) of the Act, 21 U.S.C. section 360bbb-3(b)(1), unless the authorization is terminated or revoked sooner.    Influenza A by PCR  NEGATIVE NEGATIVE   Influenza B by PCR NEGATIVE NEGATIVE    Comment: (NOTE) The Xpert Xpress SARS-CoV-2/FLU/RSV assay is intended as an aid in  the diagnosis of influenza from Nasopharyngeal swab specimens and  should not be used as a sole basis for treatment. Nasal washings and  aspirates are unacceptable for Xpert Xpress SARS-CoV-2/FLU/RSV  testing. Fact Sheet for Patients: PinkCheek.be Fact Sheet for Healthcare Providers: GravelBags.it This test is not yet approved or cleared by the [place] FDA and  has been authorized for detection and/or diagnosis of SARS-CoV-2 by  FDA under an Emergency Use Authorization (EUA). This EUA will remain  in effect (meaning this test can be used) for the duration of the  Covid-19 declaration under Section 564(b)(1) of the Act, 21  U.S.C. section 360bbb-3(b)(1), unless the authorization is  terminated or revoked. Performed at [facility], [place] [address] [place], [place] [address]   Troponin I (High Sensitivity)     Status: Abnormal   Collection Time: 10/27/19  1:00 PM  Result Value Ref Range   Troponin I (High Sensitivity) 53 (H) <18 ng/L    Comment: (NOTE) Elevated high sensitivity troponin I (hsTnI) values and significant  changes across serial measurements may suggest ACS but many other  chronic and acute conditions are known to elevate hsTnI results.  Refer to the "Links" section for chest pain  algorithms and additional  guidance. Performed at [facility], [place] [address] [place], [place] [address]   Lactic acid, plasma     Status: Abnormal   Collection Time: 10/27/19  1:10 PM  Result Value Ref Range   Lactic Acid, Venous 4.5 (HH) 0.5 - 1.9 mmol/L    Comment: CRITICAL VALUE NOTED.  VALUE IS CONSISTENT WITH PREVIOUSLY REPORTED AND CALLED VALUE. Performed at [facility], [place] [address], [place], [place] [address]   CBG monitoring, ED     Status: Abnormal    Collection Time: 10/27/19  4:26 PM  Result Value Ref Range   Glucose-Capillary 245 (H) 70 - 99 mg/dL   NM Pulmonary Perfusion  Result Date: 10/27/2019 CLINICAL DATA:  Hypoxia.  Elevated D-dimer EXAM: NUCLEAR MEDICINE PERFUSION LUNG SCAN TECHNIQUE: Perfusion images were obtained in multiple projections after intravenous injection of radiopharmaceutical. Ventilation scans intentionally deferred if perfusion scan and chest x-ray adequate for interpretation during COVID 19 epidemic. Views: Anterior, posterior, left lateral, right lateral, RPO, LPO, RAO, LAO RADIOPHARMACEUTICALS:  1.54 mCi Tc-27m MAA IV COMPARISON:  Chest radiograph October 27, 2019 FINDINGS: Radiotracer uptake is homogeneous and symmetric bilaterally. No perfusion defects evident. IMPRESSION: No demonstrable perfusion defects. Very low probability of pulmonary embolus. Electronically Signed   By: Lowella Grip III M.D.   On: 10/27/2019 16:28   DG Chest Port 1 View  Result Date: 10/27/2019 CLINICAL DATA:  COVID positive.  Hypoxia EXAM: PORTABLE CHEST 1 VIEW COMPARISON:  None FINDINGS: Bibasilar infiltrates compatible with pneumonia. Negative for heart failure or effusion. Atherosclerotic aorta. Chronic appearing rib fractures on the right. Degenerative change in the right shoulder. IMPRESSION: Bibasilar infiltrates consistent with pneumonia Electronically Signed   By: Franchot Gallo M.D.   On: 10/27/2019 11:07   ECHOCARDIOGRAM LIMITED  Result Date: 10/27/2019   ECHOCARDIOGRAM REPORT   Patient Name:   Chloe Ramsey Date of Exam: 10/27/2019 Medical Rec #:  [id number]  Height:       66.0 in Accession #:    [id number] Weight:       176.0 lb Date of Birth:  [birth year]  BSA:          1.89 m Patient Age:    78 years   BP:           95/44 mmHg Patient Gender: F          HR:           90 bpm. Exam Location:  Inpatient Procedure: Limited Echo STAT ECHO Indications:    Congestive heart Failure I50.31  History:        Patient has no prior history of  Echocardiogram examinations.                 CHF, CAD, Covid-19 Positive; Risk Factors:Hypertension,                 Dyslipidemia and Diabetes.  Sonographer:    Mikki Santee RDCS (AE) Referring Phys: [place]  1. Left ventricular ejection fraction, by visual estimation, is 60 to 65%. The left ventricle has normal function. There is mildly increased left ventricular hypertrophy.  2. Elevated left atrial pressure.  3. Left ventricular diastolic parameters are consistent with Grade II diastolic dysfunction (pseudonormalization).  4. The left ventricle has no regional wall motion abnormalities.  5. Global right ventricle has normal systolic function.The right ventricular size is normal. No increase in right ventricular wall thickness.  6. Left atrial size was  mildly dilated.  7. Right atrial size was normal.  8. Mild mitral annular calcification.  9. The mitral valve is normal in structure. No evidence of mitral valve regurgitation. No evidence of mitral stenosis. 10. The tricuspid valve is normal in structure. 11. The aortic valve is normal in structure. Aortic valve regurgitation is not visualized. Mild aortic valve sclerosis without stenosis. 12. The pulmonic valve was normal in structure. Pulmonic valve regurgitation is not visualized. 13. Normal pulmonary artery systolic pressure. 14. The inferior vena cava is normal in size with greater than 50% respiratory variability, suggesting right atrial pressure of 3 mmHg. FINDINGS  Left Ventricle: Left ventricular ejection fraction, by visual estimation, is 60 to 65%. The left ventricle has normal function. The left ventricle has no regional wall motion abnormalities. There is mildly increased left ventricular hypertrophy. Concentric left ventricular hypertrophy. Left ventricular diastolic parameters are consistent with Grade II diastolic dysfunction (pseudonormalization). Elevated left atrial pressure. Right Ventricle: The right ventricular  size is normal. No increase in right ventricular wall thickness. Global RV systolic function is has normal systolic function. The tricuspid regurgitant velocity is 2.52 m/s, and with an assumed right atrial pressure  of 3 mmHg, the estimated right ventricular systolic pressure is normal at 28.4 mmHg. Left Atrium: Left atrial size was mildly dilated. Right Atrium: Right atrial size was normal in size Pericardium: There is no evidence of pericardial effusion. Mitral Valve: The mitral valve is normal in structure. Mild mitral annular calcification. No evidence of mitral valve regurgitation. No evidence of mitral valve stenosis by observation. Tricuspid Valve: The tricuspid valve is normal in structure. Tricuspid valve regurgitation is trivial. Aortic Valve: The aortic valve is normal in structure. Aortic valve regurgitation is not visualized. Mild aortic valve sclerosis is present, with no evidence of aortic valve stenosis. Pulmonic Valve: The pulmonic valve was normal in structure. Pulmonic valve regurgitation is not visualized. Pulmonic regurgitation is not visualized. Aorta: The aortic root, ascending aorta and aortic arch are all structurally normal, with no evidence of dilitation or obstruction. Venous: The inferior vena cava is normal in size with greater than 50% respiratory variability, suggesting right atrial pressure of 3 mmHg. IAS/Shunts: No atrial level shunt detected by color flow Doppler. There is no evidence of a patent foramen ovale. No ventricular septal defect is seen or detected. There is no evidence of an atrial septal defect.  LEFT VENTRICLE PLAX 2D LVIDd:         3.30 cm Diastology LVIDs:         2.40 cm LV e' lateral:   4.79 cm/s LV PW:         1.20 cm LV E/e' lateral: 15.4 LV IVS:        1.20 cm LV e' medial:    5.44 cm/s LV SV:         24 ml   LV E/e' medial:  13.5 LV SV Index:   12.30  LEFT ATRIUM           Index       RIGHT ATRIUM           Index LA diam:      3.10 cm 1.64 cm/m  RA Area:      15.50 cm LA Vol (A2C): 37.1 ml 19.59 ml/m RA Volume:   40.20 ml  21.22 ml/m LA Vol (A4C): 65.1 ml 34.37 ml/m   AORTA Ao Root diam: 3.30 cm MITRAL VALVE  TRICUSPID VALVE MV Area (PHT): 2.80 cm             TR Peak grad:   25.4 mmHg MV PHT:        78.59 msec           TR Vmax:        252.00 cm/s MV Decel Time: 271 msec MV E velocity: 73.70 cm/s 103 cm/s MV A velocity: 68.60 cm/s 70.3 cm/s MV E/A ratio:  1.07       1.5  Mihai Croitoru MD Electronically signed by Sanda Klein MD Signature Date/Time: 10/27/2019/6:16:16 PM    Final     Pending Labs Unresulted Labs (From admission, onward)    Start     Ordered   11/03/19 0500  Creatinine, serum  (enoxaparin (LOVENOX)    CrCl < 30 ml/min)  Weekly,   R    Comments: while on enoxaparin therapy.    10/27/19 1430   10/28/19 0500  CBC with Differential/Platelet  Daily,   R     10/27/19 1430   10/28/19 0500  C-reactive protein  Daily,   R     10/27/19 1430   10/28/19 0500  Comprehensive metabolic panel  Daily,   R     10/27/19 1430   10/28/19 0500  Ferritin  Daily,   R     10/27/19 1430   10/28/19 [id number]  Basic metabolic panel  Daily,   R     10/27/19 1615   10/27/19 1030  Urinalysis, Routine w reflex microscopic  ONCE - STAT,   STAT     10/27/19 1030   10/27/19 1030  Urine culture  ONCE - STAT,   STAT     10/27/19 1030   10/27/19 1027  Blood Culture (routine x 2)  BLOOD CULTURE X 2,   STAT     10/27/19 1027          Vitals/Pain Today's Vitals   10/27/19 1654 10/27/19 1700 10/27/19 1730 10/27/19 1830  BP:  (!) 91/46 (!) 91/45 (!) 103/50  Pulse:  65 (!) 53 61  Resp:  (!) 27 (!) 28 (!) 24  Temp: (!) 96.7 F (35.9 C)     TempSrc: Oral     SpO2:  91% (!) 88% 94%  Weight:      Height:        Isolation Precautions Airborne and Contact precautions  Medications Medications  amLODipine (NORVASC) tablet 2.5 mg (2.5 mg Oral Not Given 10/27/19 1635)  carvedilol (COREG) tablet 12.5 mg (12.5 mg Oral Not Given 10/27/19  1636)  furosemide (LASIX) tablet 20 mg (20 mg Oral Given 10/27/19 1634)  haloperidol (HALDOL) tablet 1 mg (1 mg Oral Not Given 10/27/19 1645)  mirtazapine (REMERON) tablet 7.5 mg (has no administration in time range)  fesoterodine (TOVIAZ) tablet 4 mg (has no administration in time range)  0.45 % sodium chloride infusion ( Intravenous New Bag/Given 10/27/19 1624)  remdesivir 200 mg in sodium chloride 0.9% 250 mL IVPB (0 mg Intravenous Stopped 10/27/19 1843)    Followed by  remdesivir 100 mg in sodium chloride 0.9 % 100 mL IVPB (has no administration in time range)  dexamethasone (DECADRON) tablet 6 mg (6 mg Oral Given 10/27/19 1635)  acetaminophen (TYLENOL) tablet 650 mg (has no administration in time range)  docusate sodium (COLACE) capsule 100 mg (100 mg Oral Not Given 10/27/19 1652)  insulin aspart (novoLOG) injection 0-20 Units (7 Units Subcutaneous Given 10/27/19 1627)  insulin detemir (LEVEMIR) injection 6  Units (6 Units Subcutaneous Given 10/27/19 1912)  heparin injection 5,000 Units (5,000 Units Subcutaneous Given 10/27/19 1628)  meropenem (MERREM) 500 mg in sodium chloride 0.9 % 100 mL IVPB (500 mg Intravenous New Bag/Given 10/27/19 1911)  vancomycin variable dose per unstable renal function (pharmacist dosing) (has no administration in time range)  sodium chloride 0.9 % bolus 1,000 mL (0 mLs Intravenous Stopped 10/27/19 1120)    And  sodium chloride 0.9 % bolus 500 mL (0 mLs Intravenous Stopped 10/27/19 1219)    And  sodium chloride 0.9 % bolus 250 mL (0 mLs Intravenous Stopped 10/27/19 1219)  levofloxacin (LEVAQUIN) IVPB 750 mg (0 mg Intravenous Stopped 10/27/19 1317)  sodium chloride 0.9 % bolus 500 mL (0 mLs Intravenous Stopped 10/27/19 1438)  acetaminophen (TYLENOL) suppository 650 mg (650 mg Rectal Given 10/27/19 1630)  vancomycin (VANCOREADY) IVPB 1500 mg/300 mL (0 mg Intravenous Stopped 10/27/19 1910)  technetium albumin aggregated (MAA) injection solution 0000000 millicurie (0000000 millicuries  Intravenous Contrast Given 10/27/19 1530)    Mobility walks with device High fall risk   Focused Assessments Pulmonary Assessment Handoff:  Lung sounds: L Breath Sounds: Rhonchi, Diminished R Breath Sounds: Rhonchi, Diminished O2 Device: NRB        R Recommendations: See Admitting Provider Note  Report given to:   Additional Notes: Patient has dementia; according to facility, patient was walking around as normal yesterday.

## 2019-10-27 NOTE — Progress Notes (Signed)
Pharmacy Antibiotic Note  Chloe Ramsey is a 86 y.o. female admitted on 10/27/2019 from SNF with hypoxia/hypotension.  Pharmacy has been consulted for Levaquin dosing.  Hx of PCN allergy, unable to clarify with SNF, no hx of tolerating cephalosporins, pt currently on NRB.  SCr 3.05 (unknown BL) CrCl ~ 13 ml/min  Plan: Levaquin 500 mg IV every 48 hours Monitor renal function, Cx, clinical progression and LOT F/u ability to clarify PCN allergy further and narrow  Height: 5\' 6"  (167.6 cm) Weight: 176 lb (79.8 kg) IBW/kg (Calculated) : 59.3  Temp (24hrs), Avg:100.7 F (38.2 C), Min:100.7 F (38.2 C), Max:100.7 F (38.2 C)  No results for input(s): WBC, CREATININE, LATICACIDVEN, VANCOTROUGH, VANCOPEAK, VANCORANDOM, GENTTROUGH, GENTPEAK, GENTRANDOM, TOBRATROUGH, TOBRAPEAK, TOBRARND, AMIKACINPEAK, AMIKACINTROU, AMIKACIN in the last 168 hours.  CrCl cannot be calculated (No successful lab value found.).    Allergies  Allergen Reactions  . Lopid [Gemfibrozil]   . Penicillins     Bertis Ruddy, PharmD Clinical Pharmacist Please check AMION for all [place] numbers 10/27/2019 11:09 AM

## 2019-10-27 NOTE — ED Triage Notes (Signed)
Per [facility] pt from [facility], they were called for hypoxia, hypotension and hyperglycemic.  Pt usually ambulatory with a walker, was walking around yesterday with no concerns.  Covid + 10/07/2019  On their arrival 5L [place] sats 68-70%. They placed her on NRB sats increased to 90%  BS bilateral Diminished lower with upper Left rhonchi   BP 86/42 HR 90 RR 42 89% 15 L NRB   18 g LAC 700 NS bolus given  Pt DNR with comfort measures, fluids and antibiotics only at bedside

## 2019-10-27 NOTE — H&P (Signed)
History and Physical    [name] [id number] DOB: 01-30-1930 DOA: 10/27/2019  PCP: Hilbert Corrigan, MD (Confirm with patient/family/NH records and if not entered, this has to be entered at [facility] point of entry) Patient coming from: coming from SNF  I have personally briefly reviewed patient's old medical records in [name]  Chief Complaint: increased SOB and low O2 sat  HPI: Chloe Ramsey is a 86 y.o. female with medical history significant of h/o CAD, CHF, dementia, HLD, HTN, PUD, presenting for evaluation via EMS from [facility] for evaluation of hypoxia, hypotension and hyperglycemia.  Per facility, patient was diagnosed with the coronavirus on 12/23.  She had not been requiring oxygen up until today.  Per facility she had sats in the upper 60s, low 70s on 5 L O2.  Upon EMS arrival she was placed on the nonrebreather and sats improved to the 90s. She was given 700cc IVF en route.   ED Course: In the ED patient was called code sepsis: Temp 100.28F, given bolus IV Fluids in setting of hypotension, Levaquin was initiated. Lab revealed Covid Ag positive, marked leukocystosis to 53.5 with 97% segs, elevated inflammatory markers with ferritin 375, LDH 218, CRP 14.1, lactic acid 5.5, procalcitonin 14.1. She was hyperglycemic to  376. CXR with bilateral infiltrates. [facility] called to admit patient with Covid 19 PNA with probable secondary bacterial PNA. Elevated troponin may represent strain 2/2 respiratory failure.  Review of Systems: As per HPI otherwise 10 point review of systems negative. Caveat - patient demented and a poor historian. Unacceptable ROS statements: "10 systems reviewed," "Extensive" (without elaboration).  Acceptable ROS statements: "All others negative," "All others reviewed and are negative," and "All others unremarkable," with at [place] documented Can't double dip - if using for HPI can't use for ROS  Past Medical History:  Diagnosis Date  . CAD (coronary  artery disease)   . CHF (congestive heart failure) ([place])   . COVID-19   . Dementia ([place])   . Diabetes mellitus without complication ([place])   . H/O coronary angioplasty   . Hyperlipidemia   . Hypertension   . Peptic ulcer     History reviewed. No pertinent surgical history.   Patient unable to give h/o. Lives in SNF. Has family 3 hours away   has an unknown smoking status. She has never used smokeless tobacco. She reports previous alcohol use. She reports previous drug use.  Allergies  Allergen Reactions  . Lopid [Gemfibrozil]   . Penicillins     History reviewed. No pertinent family history. Unacceptable: Noncontributory, unremarkable, or negative. Acceptable: Family history reviewed and not pertinent (If you reviewed it)  Prior to Admission medications   Medication Sig Start Date End Date Taking? Authorizing Provider  acetaminophen (TYLENOL) 500 MG tablet Take 1,000 mg by mouth daily.   Yes [provider]  amLODipine (NORVASC) 2.5 MG tablet Take 2.5 mg by mouth daily.   Yes [provider]  apixaban (ELIQUIS) 2.5 MG TABS tablet Take 2.5 mg by mouth 2 (two) times daily. For 21 days, started 10-10-19   Yes [provider]  aspirin 81 MG chewable tablet Chew 81 mg by mouth daily.   Yes [provider]  atorvastatin (LIPITOR) 40 MG tablet Take 40 mg by mouth at bedtime.   Yes [provider]  calcium citrate (CALCITRATE - DOSED IN MG ELEMENTAL CALCIUM) 950 (200 Ca) MG tablet Take 200 mg of elemental calcium by mouth daily.   Yes  [provider]  carvedilol (COREG) 12.5 MG tablet Take 12.5 mg by mouth 2 (two) times daily with a meal.   Yes [provider]  Cholecalciferol (VITAMIN D) 50 MCG (2000 UT) CAPS Take 2,000 Units by mouth daily.   Yes [provider]  donepezil (ARICEPT) 10 MG tablet Take 10 mg by mouth at bedtime.   Yes [provider]  furosemide (LASIX) 20 MG tablet Take 20 mg by mouth  daily.   Yes [provider]  haloperidol (HALDOL) 1 MG tablet Take 1 mg by mouth 3 (three) times daily.   Yes [provider]  hydrochlorothiazide (MICROZIDE) 12.5 MG capsule Take 12.5 mg by mouth daily.   Yes [provider]  mirtazapine (REMERON) 7.5 MG tablet Take 7.5 mg by mouth at bedtime.   Yes [provider]  tolterodine (DETROL LA) 4 MG 24 hr capsule Take 4 mg by mouth daily.   Yes [provider]  valsartan (DIOVAN) 320 MG tablet Take 320 mg by mouth daily.   Yes [provider]    Physical Exam: Vitals:   10/27/19 1035 10/27/19 1100 10/27/19 1130 10/27/19 1200  BP: (!) 81/44 (!) 92/45 (!) 89/38 (!) 95/44  Pulse: 89 80  90  Resp: (!) 30 (!) 24 (!) 23 (!) 39  Temp: (!) 100.7 F (38.2 C)     TempSrc: Rectal     SpO2: 98% 100%  99%  Weight:      Height:        Constitutional: NAD, calm, comfortable Vitals:   10/27/19 1035 10/27/19 1100 10/27/19 1130 10/27/19 1200  BP: (!) 81/44 (!) 92/45 (!) 89/38 (!) 95/44  Pulse: 89 80  90  Resp: (!) 30 (!) 24 (!) 23 (!) 39  Temp: (!) 100.7 F (38.2 C)     TempSrc: Rectal     SpO2: 98% 100%  99%  Weight:      Height:       General -  Elderly woman with mild respiratory distress Eyes: PERRL, lids and conjunctivae normal ENMT: Mucous membranes are moist. Posterior pharynx clear of any exudate or lesions. Neck: normal, supple, no masses, no thyromegaly Respiratory: Decreased breath sounds, coarse rales mid-way up bilaterally, increased WOB with neck retraction and use of abdominal musculature Cardiovascular: Regular rate and rhythm, no murmurs / rubs / gallops. No extremity edema. 2+ pedal pulses. No carotid bruits.  Abdomen: no tenderness, no masses palpated. No hepatosplenomegaly. Bowel sounds positive.  Musculoskeletal: no clubbing / cyanosis. No joint deformity upper and lower extremities.Decreased  muscle tone.  Skin: no rashes, lesions, ulcers. No induration Neurologic: CN  2-12 grossly intact.   Psychiatric:  Demented by history, not communicative. Does not seem aware of her condition. Awake, answers simple question, e.g. do you hurt.   (Anything < 9 systems with 2 bullets each down codes to level 1) (If patient refuses exam can't bill higher level) (Make sure to document decubitus ulcers present on admission -- if possible -- and whether patient has chronic indwelling catheter at time of admission)  Labs on Admission: I have personally reviewed following labs and imaging studies  CBC: Recent Labs  Lab 10/27/19 1037  WBC 53.5*  NEUTROABS 51.9*  HGB 12.7  HCT 39.6  MCV 97.1  PLT 0000000   Basic Metabolic Panel: Recent Labs  Lab 10/27/19 1037  NA 137  K 4.8  CL 102  CO2 22  GLUCOSE 376*  BUN 72*  CREATININE 3.05*  CALCIUM 9.5  GFR: Estimated Creatinine Clearance: 13.8 mL/min (A) (by C-G formula based on SCr of 3.05 mg/dL (H)). Liver Function Tests: Recent Labs  Lab 10/27/19 1037  AST 21  ALT 20  ALKPHOS 77  BILITOT 0.9  PROT 5.1*  ALBUMIN 2.3*   No results for input(s): LIPASE, AMYLASE in the last 168 hours. No results for input(s): AMMONIA in the last 168 hours. Coagulation Profile: Recent Labs  Lab 10/27/19 1037  INR 1.6*   Cardiac Enzymes: No results for input(s): CKTOTAL, CKMB, CKMBINDEX, TROPONINI in the last 168 hours. BNP (last 3 results) No results for input(s): PROBNP in the last 8760 hours. HbA1C: No results for input(s): HGBA1C in the last 72 hours. CBG: Recent Labs  Lab 10/27/19 1016  GLUCAP 377*   Lipid Profile: Recent Labs    10/27/19 1050  TRIG 311*   Thyroid Function Tests: No results for input(s): TSH, T4TOTAL, FREET4, T3FREE, THYROIDAB in the last 72 hours. Anemia Panel: Recent Labs    10/27/19 1037  FERRITIN 375*   Urine analysis: No results found for: COLORURINE, APPEARANCEUR, LABSPEC, PHURINE, GLUCOSEU, HGBUR, BILIRUBINUR, KETONESUR, PROTEINUR, UROBILINOGEN, NITRITE,  LEUKOCYTESUR  Radiological Exams on Admission: DG Chest Port 1 View  Result Date: 10/27/2019 CLINICAL DATA:  COVID positive.  Hypoxia EXAM: PORTABLE CHEST 1 VIEW COMPARISON:  None FINDINGS: Bibasilar infiltrates compatible with pneumonia. Negative for heart failure or effusion. Atherosclerotic aorta. Chronic appearing rib fractures on the right. Degenerative change in the right shoulder. IMPRESSION: Bibasilar infiltrates consistent with pneumonia Electronically Signed   By: Franchot Gallo M.D.   On: 10/27/2019 11:07    EKG: Independently reviewed. SR, PVC's RBBB  Assessment/Plan Active Problems:   Acute on chronic combined systolic and diastolic CHF (congestive heart failure) (HCC)   Pneumonia due to COVID-19 virus   HAP (hospital-acquired pneumonia)   Type 2 diabetes mellitus with stage 4 chronic kidney disease (HCC)   Hypertension complicating diabetes ([place])  (please populate well all problems here in Problem List. (For example, if patient is on BP meds at home and you resume or decide to hold them, it is a problem that needs to be her. Same for CAD, COPD, HLD and so on)   1. Pneumonia - patient with mixed picture of Covid 19 and bacterial PNA/HAP with inflammatory markers elevated, elevated leukocytosis, elevated lactic acid, procalcitonin, hypoxemia, and increased WOB Plan ABX -continue levaquin, add imipenem and vanc  Remdesivir per pharmacy ` Decadron 6 mg daily  O2 support to keep sat >90%  Admit to [place]  Covid protocols including monitoring inflammatory markers  2. CHF - no Echo data available  CXR without pulmonary edema Plan` Continue home regimen including furosemide  2 D echo  3. Diabetes with renal involvement - no prior labs. Med list w/o diabetic meds Plan Glycemic protocol including basal insulin and SS  A1C pending  Acute on chronic CKD - patient has been vigorously hydrated.  F/u lab  4. HTN - patient has been hypotensive butis responding to fluid resuscitation.  Will continue her home meds but hold for SBP < 90   DVT prophylaxis: lovenox - stopped eliquis in acute setting (Lovenox/Heparin/SCD's/anticoagulated/None (if comfort care) Code Status: DNR per son (Full/Partial (specify details) Family Communication: spoke with son and other children. They concur with aggressive treatment for PNA (Specify name, relationship. Do not write "discussed with patient". Specify tel # if discussed over the phone) Disposition Plan: SNF when medically stable  (specify when and where you expect patient to be discharged) Consults  called: none (with names) Admission status: in-patient (inpatient / obs / tele / medical floor / SDU)   Adella Hare MD [facility] Pager [phone]  If 7PM-7AM, please contact night-coverage [email or web address] Password [facility]  10/27/2019, 2:42 PM

## 2019-10-27 NOTE — Progress Notes (Signed)
  Echocardiogram 2D Echocardiogram has been performed.  Jennette Dubin 10/27/2019, 4:57 PM

## 2019-10-27 NOTE — ED Notes (Signed)
purewick is hooked to suction.

## 2019-10-27 NOTE — ED Notes (Signed)
Patient transported to nuc med

## 2019-10-27 NOTE — ED Notes (Signed)
Contacted pt's son, confirmed penicillin allergy for pharmacy. Pt's son was updated by MD recently, working on decision for plan of care

## 2019-10-28 ENCOUNTER — Encounter (HOSPITAL_COMMUNITY): Payer: Self-pay | Admitting: Internal Medicine

## 2019-10-28 ENCOUNTER — Inpatient Hospital Stay (HOSPITAL_COMMUNITY): Payer: Medicare HMO

## 2019-10-28 DIAGNOSIS — R7881 Bacteremia: Secondary | ICD-10-CM

## 2019-10-28 DIAGNOSIS — N179 Acute kidney failure, unspecified: Secondary | ICD-10-CM

## 2019-10-28 LAB — BLOOD CULTURE ID PANEL (REFLEXED)

## 2019-10-28 LAB — URINALYSIS, ROUTINE W REFLEX MICROSCOPIC
Bilirubin Urine: NEGATIVE
Glucose, UA: 150 mg/dL — AB
Hgb urine dipstick: NEGATIVE
Ketones, ur: NEGATIVE mg/dL
Nitrite: NEGATIVE
Protein, ur: NEGATIVE mg/dL
Specific Gravity, Urine: 1.019 (ref 1.005–1.030)
pH: 5 (ref 5.0–8.0)

## 2019-10-28 LAB — FERRITIN: Ferritin: 342 ng/mL — ABNORMAL HIGH (ref 11–307)

## 2019-10-28 LAB — COMPREHENSIVE METABOLIC PANEL
ALT: 19 U/L (ref 0–44)
AST: 20 U/L (ref 15–41)
Albumin: 2.3 g/dL — ABNORMAL LOW (ref 3.5–5.0)
Alkaline Phosphatase: 85 U/L (ref 38–126)
Anion gap: 10 (ref 5–15)
BUN: 71 mg/dL — ABNORMAL HIGH (ref 8–23)
CO2: 22 mmol/L (ref 22–32)
Calcium: 9 mg/dL (ref 8.9–10.3)
Chloride: 110 mmol/L (ref 98–111)
Creatinine, Ser: 1.83 mg/dL — ABNORMAL HIGH (ref 0.44–1.00)
GFR calc Af Amer: 28 mL/min — ABNORMAL LOW (ref >60)
GFR calc non Af Amer: 24 mL/min — ABNORMAL LOW (ref >60)
Glucose, Bld: 119 mg/dL — ABNORMAL HIGH (ref 70–99)
Potassium: 3.9 mmol/L (ref 3.5–5.1)
Sodium: 142 mmol/L (ref 135–145)
Total Bilirubin: 0.6 mg/dL (ref 0.3–1.2)
Total Protein: 5.1 g/dL — ABNORMAL LOW (ref 6.5–8.1)

## 2019-10-28 LAB — D-DIMER, QUANTITATIVE: D-Dimer, Quant: 0.8 ug/mL-FEU — ABNORMAL HIGH (ref 0.00–0.50)

## 2019-10-28 LAB — GLUCOSE, CAPILLARY
Glucose-Capillary: 127 mg/dL — ABNORMAL HIGH (ref 70–99)
Glucose-Capillary: 135 mg/dL — ABNORMAL HIGH (ref 70–99)
Glucose-Capillary: 146 mg/dL — ABNORMAL HIGH (ref 70–99)
Glucose-Capillary: 170 mg/dL — ABNORMAL HIGH (ref 70–99)
Glucose-Capillary: 171 mg/dL — ABNORMAL HIGH (ref 70–99)
Glucose-Capillary: 340 mg/dL — ABNORMAL HIGH (ref 70–99)

## 2019-10-28 LAB — CBC WITH DIFFERENTIAL/PLATELET
Abs Immature Granulocytes: 0.34 10*3/uL — ABNORMAL HIGH (ref 0.00–0.07)
Basophils Absolute: 0 10*3/uL (ref 0.0–0.1)
Basophils Relative: 0 %
Eosinophils Absolute: 0.1 10*3/uL (ref 0.0–0.5)
Eosinophils Relative: 0 %
HCT: 35.6 % — ABNORMAL LOW (ref 36.0–46.0)
Hemoglobin: 12 g/dL (ref 12.0–15.0)
Immature Granulocytes: 1 %
Lymphocytes Relative: 3 %
Lymphs Abs: 0.8 10*3/uL (ref 0.7–4.0)
MCH: 31.3 pg (ref 26.0–34.0)
MCHC: 33.7 g/dL (ref 30.0–36.0)
MCV: 93 fL (ref 80.0–100.0)
Monocytes Absolute: 0.8 10*3/uL (ref 0.1–1.0)
Monocytes Relative: 3 %
Neutro Abs: 24.3 10*3/uL — ABNORMAL HIGH (ref 1.7–7.7)
Neutrophils Relative %: 93 %
Platelets: 230 10*3/uL (ref 150–400)
RBC: 3.83 MIL/uL — ABNORMAL LOW (ref 3.87–5.11)
RDW: 13.2 % (ref 11.5–15.5)
WBC: 26.3 10*3/uL — ABNORMAL HIGH (ref 4.0–10.5)
nRBC: 0 % (ref 0.0–0.2)

## 2019-10-28 LAB — C-REACTIVE PROTEIN: CRP: 18.3 mg/dL — ABNORMAL HIGH (ref <1.0)

## 2019-10-28 MED ORDER — ENSURE ENLIVE PO LIQD
237.0000 mL | Freq: Three times a day (TID) | ORAL | Status: DC
  Administered 2019-10-28 – 2019-10-31 (×8): 237 mL via ORAL

## 2019-10-28 MED ORDER — SODIUM CHLORIDE 0.9 % IV SOLN
1.0000 g | Freq: Three times a day (TID) | INTRAVENOUS | Status: DC
  Administered 2019-10-28 – 2019-10-29 (×3): 1 g via INTRAVENOUS
  Filled 2019-10-28 (×5): qty 1

## 2019-10-28 MED ORDER — CARVEDILOL 3.125 MG PO TABS
6.2500 mg | ORAL_TABLET | Freq: Two times a day (BID) | ORAL | Status: DC
  Administered 2019-10-28: 08:00:00 6.25 mg via ORAL
  Filled 2019-10-28: qty 2

## 2019-10-28 MED ORDER — MIDODRINE HCL 5 MG PO TABS
2.5000 mg | ORAL_TABLET | Freq: Three times a day (TID) | ORAL | Status: DC
  Administered 2019-10-28: 16:00:00 2.5 mg via ORAL
  Filled 2019-10-28: qty 1

## 2019-10-28 NOTE — Progress Notes (Addendum)
PHARMACY - PHYSICIAN COMMUNICATION CRITICAL VALUE ALERT - BLOOD CULTURE IDENTIFICATION (BCID)  Chloe Ramsey is an 86 y.o. female who presented to [facility] on 10/27/2019 with a chief complaint of COVID 19, also bacterial PNA.   Assessment:  Kleb pneumo (no resistance) bacteremia - source thought to be PNA  Name of physician (or Provider) Contacted: Dr. Vanita Ingles  Current antibiotics: Meropenem and Vancomycin  Changes to prescribed antibiotics recommended: MD ok to d/c Vanc for now. Will try to clarify PCN allergy 1/13 a.m. in order to narrow meropenem to ceftriaxone ideally. If cannot clarify, consider aztreonam. Will d/w rounding MD in a.m.   Results for orders placed or performed during the hospital encounter of 10/27/19  Blood Culture ID Panel (Reflexed) (Collected: 10/27/2019 10:36 AM)  Result Value Ref Range   Enterococcus species NOT DETECTED NOT DETECTED   Listeria monocytogenes NOT DETECTED NOT DETECTED   Staphylococcus species NOT DETECTED NOT DETECTED   Staphylococcus aureus (BCID) NOT DETECTED NOT DETECTED   Streptococcus species NOT DETECTED NOT DETECTED   Streptococcus agalactiae NOT DETECTED NOT DETECTED   Streptococcus pneumoniae NOT DETECTED NOT DETECTED   Streptococcus pyogenes NOT DETECTED NOT DETECTED   Acinetobacter baumannii NOT DETECTED NOT DETECTED   Enterobacteriaceae species DETECTED (A) NOT DETECTED   Enterobacter cloacae complex NOT DETECTED NOT DETECTED   Escherichia coli NOT DETECTED NOT DETECTED   Klebsiella oxytoca NOT DETECTED NOT DETECTED   Klebsiella pneumoniae DETECTED (A) NOT DETECTED   Proteus species NOT DETECTED NOT DETECTED   Serratia marcescens NOT DETECTED NOT DETECTED   Carbapenem resistance NOT DETECTED NOT DETECTED   Haemophilus influenzae NOT DETECTED NOT DETECTED   Neisseria meningitidis NOT DETECTED NOT DETECTED   Pseudomonas aeruginosa NOT DETECTED NOT DETECTED   Candida albicans NOT DETECTED NOT DETECTED   Candida glabrata NOT DETECTED  NOT DETECTED   Candida krusei NOT DETECTED NOT DETECTED   Candida parapsilosis NOT DETECTED NOT DETECTED   Candida tropicalis NOT DETECTED NOT DETECTED    Sherlon Handing, PharmD, BCPS Please see amion for complete clinical pharmacist phone list 10/28/2019  2:29 AM

## 2019-10-28 NOTE — Progress Notes (Signed)
Dr Sloan Leiter aware of pts VS. Will f/u. Will perform I/O cath UA w/culture.

## 2019-10-28 NOTE — Progress Notes (Signed)
Pt resting bed. Pt on RA and sats WNL. Renal U/S complete. SLP recommends Dysphagia II diet, pills whole in applesauce, and straws ok with drinks. Pt able to verbalize needs and wants.

## 2019-10-28 NOTE — Progress Notes (Signed)
Pt now on RA with sats 97-98%. I/O cat performed, UA sent to lab. Urine cloudy with sediment.VSS. BP now controlled.

## 2019-10-28 NOTE — Progress Notes (Signed)
Pt is alert. Pt will follow simple commands. Pt nods appropriately but is not verbal at this time. Pt tracks. Pt nods when asked if comfortable or if has any pain. Tele monitor SR. No distress noted. VSS. Will continue to monitor.

## 2019-10-28 NOTE — Progress Notes (Signed)
Initial Nutrition Assessment  RD working remotely.  DOCUMENTATION CODES:   Not applicable  INTERVENTION:   - Ensure Enlive po TID between meals, each supplement provides 350 kcal and 20 grams of protein  - Encourage adequate PO intake and provide feeding assistance as needed  - Pt receiving Ensure Enlive shake daily with breakfast meal which provides 350 kcals and 20 grams of protein. Pt also receiving Magic Cup BID with lunch and dinner meals, each supplement provides 290 kcal and 9 grams of protein, automatically on meal trays to optimize nutritional intake.  NUTRITION DIAGNOSIS:   Increased nutrient needs related to catabolic illness, acute illness (COVID-19, PNA) as evidenced by estimated needs.  GOAL:   Patient will meet greater than or equal to 90% of their needs  MONITOR:   PO intake, Supplement acceptance, Diet advancement, Labs, Weight trends  REASON FOR ASSESSMENT:   Malnutrition Screening Tool    ASSESSMENT:   86 year old female who presented to the ED on 1/12 from [facility] with SOB and hypotension. PMH of CAD, CHF, dementia, HLD, HTN, PUD. Pt diagnosed with COVID-19 on 12/23. Code Sepsis called in ED. Pt admitted with mixed picture of COVID-19 and bacterial pneumonia.   Pt drank Ensure Enlive shake this AM per RN note. RD will order TID. Per notes, family desiring treatment for PNA but no escalation of care with pressors or ICU admission.  Pt is on a full liquid diet. No meal completions documented at this time.  No weight history available in chart. Unable to obtain diet and weight history secondary to dementia.  Medications reviewed and include: decadron, colace, SSI q 4 hours, Levemir 6 units BID, Remeron, Azactam, remdesivir  Labs reviewed: BUN 71, creatinine 1.83 CBG's: 117-340 x 24 hours  NUTRITION - FOCUSED PHYSICAL EXAM:  Unable to complete at this time. RD working remotely.  Diet Order:   Diet Order            Diet full liquid Room  service appropriate? Yes; Fluid consistency: Thin  Diet effective now              EDUCATION NEEDS:   No education needs have been identified at this time  Skin:  Skin Assessment: Reviewed RN Assessment  Last BM:  10/27/19  Height:   Ht Readings from Last 1 Encounters:  10/27/19 5\' 6"  (1.676 m)    Weight:   Wt Readings from Last 1 Encounters:  10/27/19 79.8 kg    Ideal Body Weight:  59.1 kg  BMI:  Body mass index is 28.41 kg/m.  Estimated Nutritional Needs:   Kcal:  1600-1800  Protein:  80-95 grams  Fluid:  >/= 1.5 L    Gaynell Face, MS, RD, LDN Inpatient Clinical Dietitian Pager: [phone] Weekend/After Hours: [phone]

## 2019-10-28 NOTE — Plan of Care (Signed)
  Problem: Respiratory: Goal: Will maintain a patent airway Outcome: Progressing   

## 2019-10-28 NOTE — Progress Notes (Signed)
PROGRESS NOTE                                                                                                                                                                                                             Patient Demographics:    Chloe Ramsey, is a 86 y.o. female, DOB - [birth year], [id number]  Outpatient Primary MD for the patient is Hilbert Corrigan, MD   Admit date - 10/27/2019   LOS - 1  Chief Complaint  Patient presents with  . Shortness of Breath  . Hypotension       Brief Narrative: Patient is a 86 y.o. female with PMHx of dementia, chronic diastolic heart failure, CAD, HLD, HTN who is a resident of [facility]-apparently was diagnosed with Covid on 10/07/2019 presented to the ED on 1/12 with hypoxemia, hypotension-further revealed sepsis along with acute hypoxic respiratory failure secondary to Covid 19 with concurrent bacterial pneumonia and gram-negative bacteremia with acute kidney injury.  See below for further details.   Subjective:    Chloe Ramsey today remains confused but not in any distress.  No major events overnight per nursing staff.   Assessment  & Plan :   Acute Hypoxic Resp Failure due to Covid 19 Viral pneumonia and concurrent bacterial pneumonia: Remains on just 1 L of oxygen-continue steroids/remdesivir along with aztreonam.  Inflammatory markers.  Fever: afebrile  O2 requirements:  SpO2: 99 % O2 Flow Rate (L/min): 1 L/min   COVID-19 Labs: Recent Labs    10/27/19 1037  DDIMER 1.69*  FERRITIN 375*  LDH 218*  CRP 14.1*    No results found for: BNP  Recent Labs  Lab 10/27/19 1037  PROCALCITON 68.85    Lab Results  Component Value Date   SARSCOV2NAA POSITIVE (A) 10/27/2019     COVID-19 Medications: Steroids: 1/12>> Remdesivir: 1/12>>  Other medications: Diuretics:Euvolemic Antibiotics: Meropenem 1/12>> Levofloxacin 1/12 x 1 Vancomycin 1/12 x  1  Prone/Incentive Spirometry: encouraged  incentive spirometry use 3-4/hour  DVT Prophylaxis  :   Heparin - SCDs  Sepsis secondary to gram-negative bacteremia/pneumonia: Sepsis physiology present on admission-sepsis physiology has improved-blood pressure better, AKI improved.  Preliminary blood cultures positive for gram-negative bacteremia-appears to be Klebsiella.  Initially on meropenem-has been narrowed down to Azactam.  Suspect source of bacteremia from lungs given pneumonia-but will check UA/urine culture  to ensure no UTI.  Abdominal exam is completely benign-therefore doubt any GI/biliary source at this point.  AKI: Suspect hemodynamically mediated-no prior creatinine levels in chart-not sure if patient has underlying CKD at this point-we will check UA and renal ultrasound.  Thankfully creatinine improving with supportive care-continue to follow electrolytes.  Chronic diastolic heart failure: Euvolemic-follow  HTN: Hypotensive on presentation-BP improved-but still soft-we will hold Coreg and amlodipine-start low-dose midodrine for a few days.  DM-2: CBG stable with SSI-follow and adjust  CBG (last 3)  Recent Labs    10/27/19 2014 10/27/19 2345 10/28/19 0414  GLUCAP 117* 146* 171*   Dementia: Remains pleasantly confused  Other issues: Not sure if patient is on Eliquis as outpatient-we will try to get information from patient's primary care practitioner.  Consults  :  None  Procedures  :  None  ABG: No results found for: PHART, PCO2ART, PO2ART, HCO3, TCO2, ACIDBASEDEF, O2SAT  Vent Settings: N/A  Condition - Extremely Guarded  Family Communication  :  Son updated over the phone  Code Status :  DNR  Diet :  Diet Order            Diet full liquid Room service appropriate? Yes; Fluid consistency: Thin  Diet effective now               Disposition Plan  :  Remain hospitalized  Barriers to discharge: Hypoxia requiring O2 supplementation/complete 5 days of IV  Remdesivir  Antimicorbials  :    Anti-infectives (From admission, onward)   Start     Dose/Rate Route Frequency Ordered Stop   10/29/19 1300  levofloxacin (LEVAQUIN) IVPB 500 mg  Status:  Discontinued     500 mg 100 mL/hr over 60 Minutes Intravenous Every 48 hours 10/27/19 1214 10/27/19 1612   10/28/19 1000  remdesivir 100 mg in sodium chloride 0.9 % 100 mL IVPB     100 mg 200 mL/hr over 30 Minutes Intravenous Daily 10/27/19 1430 11/01/19 0959   10/27/19 1630  vancomycin (VANCOREADY) IVPB 1500 mg/300 mL     1,500 mg 150 mL/hr over 120 Minutes Intravenous  Once 10/27/19 1615 10/27/19 1910   10/27/19 1615  meropenem (MERREM) 500 mg in sodium chloride 0.9 % 100 mL IVPB     500 mg 200 mL/hr over 30 Minutes Intravenous Every 12 hours 10/27/19 1528     10/27/19 1614  vancomycin variable dose per unstable renal function (pharmacist dosing)  Status:  Discontinued      Does not apply See admin instructions 10/27/19 1615 10/28/19 0218   10/27/19 1430  remdesivir 200 mg in sodium chloride 0.9% 250 mL IVPB     200 mg 580 mL/hr over 30 Minutes Intravenous Once 10/27/19 1430 10/27/19 1843   10/27/19 1045  levofloxacin (LEVAQUIN) IVPB 750 mg     750 mg 100 mL/hr over 90 Minutes Intravenous  Once 10/27/19 1030 10/27/19 1317      Inpatient Medications  Scheduled Meds: . amLODipine  2.5 mg Oral Daily  . carvedilol  12.5 mg Oral BID WC  . dexamethasone  6 mg Oral Q24H  . docusate sodium  100 mg Oral BID  . fesoterodine  4 mg Oral Daily  . furosemide  20 mg Oral Daily  . haloperidol  1 mg Oral TID  . heparin injection (subcutaneous)  5,000 Units Subcutaneous Q8H  . insulin aspart  0-20 Units Subcutaneous Q4H  . insulin detemir  0.075 Units/kg Subcutaneous BID  . mirtazapine  7.5 mg Oral  QHS   Continuous Infusions: . sodium chloride Stopped (10/27/19 2211)  . meropenem (MERREM) IV Stopped (10/27/19 2011)  . remdesivir 100 mg in NS 100 mL     PRN Meds:.acetaminophen   Time Spent in  minutes 35    See all Orders from today for further details   Oren Binet M.D on 10/28/2019 at 7:01 AM  To page go to [email or web address] - use universal password  [facility] -  Office  [phone]    Objective:   Vitals:   10/28/19 0000 10/28/19 0203 10/28/19 0211 10/28/19 0400  BP: (!) 128/59   (!) 105/57  Pulse: 80 76 67 76  Resp: 16 18 17 16   Temp:    98.8 F (37.1 C)  TempSrc:    Axillary  SpO2: 93% 100% 100% 99%  Weight:      Height:        Wt Readings from Last 3 Encounters:  10/27/19 79.8 kg     Intake/Output Summary (Last 24 hours) at 10/28/2019 0701 Last data filed at 10/28/2019 0643 Gross per 24 hour  Intake 3597.94 ml  Output 690 ml  Net 2907.94 ml     Physical Exam Gen Exam: Pleasantly confused-chronically sick appearing. HEENT:atraumatic, normocephalic Chest: B/L clear to auscultation anteriorly CVS:S1S2 regular Abdomen:soft non tender, non distended Extremities:no edema Neurology: Appears to be moving all 4 extremities Skin: no rash   Data Review:    CBC Recent Labs  Lab 10/27/19 1037  WBC 53.5*  HGB 12.7  HCT 39.6  PLT 333  MCV 97.1  MCH 31.1  MCHC 32.1  RDW 13.0  LYMPHSABS 0.5*  MONOABS 1.1*  EOSABS 0.0  BASOSABS 0.0    Chemistries  Recent Labs  Lab 10/27/19 1037  NA 137  K 4.8  CL 102  CO2 22  GLUCOSE 376*  BUN 72*  CREATININE 3.05*  CALCIUM 9.5  AST 21  ALT 20  ALKPHOS 77  BILITOT 0.9   ------------------------------------------------------------------------------------------------------------------ Recent Labs    10/27/19 1050  TRIG 311*    No results found for: HGBA1C ------------------------------------------------------------------------------------------------------------------ No results for input(s): TSH, T4TOTAL, T3FREE, THYROIDAB in the last 72 hours.  Invalid input(s):  FREET3 ------------------------------------------------------------------------------------------------------------------ Recent Labs    10/27/19 1037  FERRITIN 375*    Coagulation profile Recent Labs  Lab 10/27/19 1037  INR 1.6*    Recent Labs    10/27/19 1037  DDIMER 1.69*    Cardiac Enzymes No results for input(s): CKMB, TROPONINI, MYOGLOBIN in the last 168 hours.  Invalid input(s): CK ------------------------------------------------------------------------------------------------------------------ No results found for: BNP  Micro Results Recent Results (from the past 240 hour(s))  Blood Culture (routine x 2)     Status: None (Preliminary result)   Collection Time: 10/27/19 10:32 AM   Specimen: BLOOD  Result Value Ref Range Status   Specimen Description BLOOD BLOOD RIGHT FOREARM  Final   Special Requests   Final    BOTTLES DRAWN AEROBIC AND ANAEROBIC Blood Culture adequate volume   Culture  Setup Time   Final    GRAM NEGATIVE RODS IN BOTH AEROBIC AND ANAEROBIC BOTTLES CRITICAL VALUE NOTED.  VALUE IS CONSISTENT WITH PREVIOUSLY REPORTED AND CALLED VALUE. Performed at [facility], [place] [address], [place], [place] [address]    Culture PENDING  Incomplete   Report Status PENDING  Incomplete  Blood Culture (routine x 2)     Status: None (Preliminary result)   Collection Time: 10/27/19 10:36 AM   Specimen: BLOOD  Result Value Ref  Range Status   Specimen Description BLOOD LEFT ANTECUBITAL  Final   Special Requests   Final    BOTTLES DRAWN AEROBIC AND ANAEROBIC Blood Culture results may not be optimal due to an inadequate volume of blood received in culture bottles   Culture  Setup Time   Final    GRAM NEGATIVE RODS IN BOTH AEROBIC AND ANAEROBIC BOTTLES Organism ID to follow CRITICAL RESULT CALLED TO, READ BACK BY AND VERIFIED WITH: K.AMEND,PHARMD AT 0205 ON 10/28/19 BY G.MCADOO Performed at [facility], [place] [address] [place], [place] [address]     Culture PENDING  Incomplete   Report Status PENDING  Incomplete  Blood Culture ID Panel (Reflexed)     Status: Abnormal   Collection Time: 10/27/19 10:36 AM  Result Value Ref Range Status   Enterococcus species NOT DETECTED NOT DETECTED Final   Listeria monocytogenes NOT DETECTED NOT DETECTED Final   Staphylococcus species NOT DETECTED NOT DETECTED Final   Staphylococcus aureus (BCID) NOT DETECTED NOT DETECTED Final   Streptococcus species NOT DETECTED NOT DETECTED Final   Streptococcus agalactiae NOT DETECTED NOT DETECTED Final   Streptococcus pneumoniae NOT DETECTED NOT DETECTED Final   Streptococcus pyogenes NOT DETECTED NOT DETECTED Final   Acinetobacter baumannii NOT DETECTED NOT DETECTED Final   Enterobacteriaceae species DETECTED (A) NOT DETECTED Final    Comment: Enterobacteriaceae represent a large family of gram-negative bacteria, not a single organism. CRITICAL RESULT CALLED TO, READ BACK BY AND VERIFIED WITH: K.AMEND,PHARMD AT 0205 ON 10/28/19 BY G.MCADOO    Enterobacter cloacae complex NOT DETECTED NOT DETECTED Final   Escherichia coli NOT DETECTED NOT DETECTED Final   Klebsiella oxytoca NOT DETECTED NOT DETECTED Final   Klebsiella pneumoniae DETECTED (A) NOT DETECTED Final    Comment: CRITICAL RESULT CALLED TO, READ BACK BY AND VERIFIED WITH: K.AMEND,PHARMD AT 0205 ON 10/28/19 BY G.MCADOO    Proteus species NOT DETECTED NOT DETECTED Final   Serratia marcescens NOT DETECTED NOT DETECTED Final   Carbapenem resistance NOT DETECTED NOT DETECTED Final   Haemophilus influenzae NOT DETECTED NOT DETECTED Final   Neisseria meningitidis NOT DETECTED NOT DETECTED Final   Pseudomonas aeruginosa NOT DETECTED NOT DETECTED Final   Candida albicans NOT DETECTED NOT DETECTED Final   Candida glabrata NOT DETECTED NOT DETECTED Final   Candida krusei NOT DETECTED NOT DETECTED Final   Candida parapsilosis NOT DETECTED NOT DETECTED Final   Candida tropicalis NOT DETECTED NOT DETECTED Final     Comment: Performed at [facility], [place]. [address], [place], [place] [address]  Respiratory Panel by RT PCR (Flu A&B, Covid) - Nasopharyngeal Swab     Status: Abnormal   Collection Time: 10/27/19 11:43 AM   Specimen: Nasopharyngeal Swab  Result Value Ref Range Status   SARS Coronavirus 2 by RT PCR POSITIVE (A) NEGATIVE Final    Comment: RESULT CALLED TO, READ BACK BY AND VERIFIED WITH: Alesia Banda RN 13:25 10/27/19 ([email or web address]) (NOTE) SARS-CoV-2 target nucleic acids are DETECTED. SARS-CoV-2 RNA is generally detectable in upper respiratory specimens  during the acute phase of infection. Positive results are indicative of the presence of the identified virus, but do not rule out bacterial infection or co-infection with other pathogens not detected by the test. Clinical correlation with patient history and other diagnostic information is necessary to determine patient infection status. The expected result is Negative. Fact Sheet for Patients:  PinkCheek.be Fact Sheet for Healthcare Providers: GravelBags.it This test is not yet approved or cleared by  the [employer] and  has been authorized for detection and/or diagnosis of SARS-CoV-2 by FDA under an Emergency Use Authorization (EUA).  This EUA will remain in effect (meaning this test can be used)  for the duration of  the COVID-19 declaration under Section 564(b)(1) of the Act, 21 U.S.C. section 360bbb-3(b)(1), unless the authorization is terminated or revoked sooner.    Influenza A by PCR NEGATIVE NEGATIVE Final   Influenza B by PCR NEGATIVE NEGATIVE Final    Comment: (NOTE) The Xpert Xpress SARS-CoV-2/FLU/RSV assay is intended as an aid in  the diagnosis of influenza from Nasopharyngeal swab specimens and  should not be used as a sole basis for treatment. Nasal washings and  aspirates are unacceptable for Xpert Xpress SARS-CoV-2/FLU/RSV  testing. Fact Sheet for  Patients: PinkCheek.be Fact Sheet for Healthcare Providers: GravelBags.it This test is not yet approved or cleared by the [place] FDA and  has been authorized for detection and/or diagnosis of SARS-CoV-2 by  FDA under an Emergency Use Authorization (EUA). This EUA will remain  in effect (meaning this test can be used) for the duration of the  Covid-19 declaration under Section 564(b)(1) of the Act, 21  U.S.C. section 360bbb-3(b)(1), unless the authorization is  terminated or revoked. Performed at [facility], [place] [address], [place], [place] [address]     Radiology Reports NM Pulmonary Perfusion  Result Date: 10/27/2019 CLINICAL DATA:  Hypoxia.  Elevated D-dimer EXAM: NUCLEAR MEDICINE PERFUSION LUNG SCAN TECHNIQUE: Perfusion images were obtained in multiple projections after intravenous injection of radiopharmaceutical. Ventilation scans intentionally deferred if perfusion scan and chest x-ray adequate for interpretation during COVID 19 epidemic. Views: Anterior, posterior, left lateral, right lateral, RPO, LPO, RAO, LAO RADIOPHARMACEUTICALS:  1.54 mCi Tc-12m MAA IV COMPARISON:  Chest radiograph October 27, 2019 FINDINGS: Radiotracer uptake is homogeneous and symmetric bilaterally. No perfusion defects evident. IMPRESSION: No demonstrable perfusion defects. Very low probability of pulmonary embolus. Electronically Signed   By: Lowella Grip III M.D.   On: 10/27/2019 16:28   DG Chest Port 1 View  Result Date: 10/27/2019 CLINICAL DATA:  COVID positive.  Hypoxia EXAM: PORTABLE CHEST 1 VIEW COMPARISON:  None FINDINGS: Bibasilar infiltrates compatible with pneumonia. Negative for heart failure or effusion. Atherosclerotic aorta. Chronic appearing rib fractures on the right. Degenerative change in the right shoulder. IMPRESSION: Bibasilar infiltrates consistent with pneumonia Electronically Signed   By: Franchot Gallo M.D.    On: 10/27/2019 11:07   ECHOCARDIOGRAM LIMITED  Result Date: 10/27/2019   ECHOCARDIOGRAM REPORT   Patient Name:   Altheia Mounts Date of Exam: 10/27/2019 Medical Rec #:  [id number]  Height:       66.0 in Accession #:    [id number] Weight:       176.0 lb Date of Birth:  Aug 21, 1930  BSA:          1.89 m Patient Age:    18 years   BP:           95/44 mmHg Patient Gender: F          HR:           90 bpm. Exam Location:  Inpatient Procedure: Limited Echo STAT ECHO Indications:    Congestive heart Failure I50.31  History:        Patient has no prior history of Echocardiogram examinations.                 CHF, CAD, Covid-19 Positive; Risk Factors:Hypertension,  Dyslipidemia and Diabetes.  Sonographer:    Mikki Santee RDCS (AE) Referring Phys: [place]  1. Left ventricular ejection fraction, by visual estimation, is 60 to 65%. The left ventricle has normal function. There is mildly increased left ventricular hypertrophy.  2. Elevated left atrial pressure.  3. Left ventricular diastolic parameters are consistent with Grade II diastolic dysfunction (pseudonormalization).  4. The left ventricle has no regional wall motion abnormalities.  5. Global right ventricle has normal systolic function.The right ventricular size is normal. No increase in right ventricular wall thickness.  6. Left atrial size was mildly dilated.  7. Right atrial size was normal.  8. Mild mitral annular calcification.  9. The mitral valve is normal in structure. No evidence of mitral valve regurgitation. No evidence of mitral stenosis. 10. The tricuspid valve is normal in structure. 11. The aortic valve is normal in structure. Aortic valve regurgitation is not visualized. Mild aortic valve sclerosis without stenosis. 12. The pulmonic valve was normal in structure. Pulmonic valve regurgitation is not visualized. 13. Normal pulmonary artery systolic pressure. 14. The inferior vena cava is normal in size with greater  than 50% respiratory variability, suggesting right atrial pressure of 3 mmHg. FINDINGS  Left Ventricle: Left ventricular ejection fraction, by visual estimation, is 60 to 65%. The left ventricle has normal function. The left ventricle has no regional wall motion abnormalities. There is mildly increased left ventricular hypertrophy. Concentric left ventricular hypertrophy. Left ventricular diastolic parameters are consistent with Grade II diastolic dysfunction (pseudonormalization). Elevated left atrial pressure. Right Ventricle: The right ventricular size is normal. No increase in right ventricular wall thickness. Global RV systolic function is has normal systolic function. The tricuspid regurgitant velocity is 2.52 m/s, and with an assumed right atrial pressure  of 3 mmHg, the estimated right ventricular systolic pressure is normal at 28.4 mmHg. Left Atrium: Left atrial size was mildly dilated. Right Atrium: Right atrial size was normal in size Pericardium: There is no evidence of pericardial effusion. Mitral Valve: The mitral valve is normal in structure. Mild mitral annular calcification. No evidence of mitral valve regurgitation. No evidence of mitral valve stenosis by observation. Tricuspid Valve: The tricuspid valve is normal in structure. Tricuspid valve regurgitation is trivial. Aortic Valve: The aortic valve is normal in structure. Aortic valve regurgitation is not visualized. Mild aortic valve sclerosis is present, with no evidence of aortic valve stenosis. Pulmonic Valve: The pulmonic valve was normal in structure. Pulmonic valve regurgitation is not visualized. Pulmonic regurgitation is not visualized. Aorta: The aortic root, ascending aorta and aortic arch are all structurally normal, with no evidence of dilitation or obstruction. Venous: The inferior vena cava is normal in size with greater than 50% respiratory variability, suggesting right atrial pressure of 3 mmHg. IAS/Shunts: No atrial level shunt  detected by color flow Doppler. There is no evidence of a patent foramen ovale. No ventricular septal defect is seen or detected. There is no evidence of an atrial septal defect.  LEFT VENTRICLE PLAX 2D LVIDd:         3.30 cm Diastology LVIDs:         2.40 cm LV e' lateral:   4.79 cm/s LV PW:         1.20 cm LV E/e' lateral: 15.4 LV IVS:        1.20 cm LV e' medial:    5.44 cm/s LV SV:         24 ml   LV E/e' medial:  13.5 LV SV Index:   12.30  LEFT ATRIUM           Index       RIGHT ATRIUM           Index LA diam:      3.10 cm 1.64 cm/m  RA Area:     15.50 cm LA Vol (A2C): 37.1 ml 19.59 ml/m RA Volume:   40.20 ml  21.22 ml/m LA Vol (A4C): 65.1 ml 34.37 ml/m   AORTA Ao Root diam: 3.30 cm MITRAL VALVE                        TRICUSPID VALVE MV Area (PHT): 2.80 cm             TR Peak grad:   25.4 mmHg MV PHT:        78.59 msec           TR Vmax:        252.00 cm/s MV Decel Time: 271 msec MV E velocity: 73.70 cm/s 103 cm/s MV A velocity: 68.60 cm/s 70.3 cm/s MV E/A ratio:  1.07       1.5  Mihai Croitoru MD Electronically signed by Sanda Klein MD Signature Date/Time: 10/27/2019/6:16:16 PM    Final

## 2019-10-28 NOTE — Progress Notes (Signed)
Pt verbalizing needs and wants. Pt ate some applesauce with meds and drank cup of water and drank ensure shake.Pt is able to move bilat arms and held cup to drink.

## 2019-10-28 NOTE — Plan of Care (Signed)
  Problem: Education: Goal: Knowledge of risk factors and measures for prevention of condition will improve Outcome: Progressing   Problem: Coping: Goal: Psychosocial and spiritual needs will be supported Outcome: Progressing   Problem: Respiratory: Goal: Will maintain a patent airway Outcome: Progressing Goal: Complications related to the disease process, condition or treatment will be avoided or minimized Outcome: Progressing   

## 2019-10-28 NOTE — Evaluation (Signed)
Clinical/Bedside Swallow Evaluation Patient Details  Name: Chloe Ramsey MRN: [id number] Date of Birth: January 07, 1930  Today's Date: 10/28/2019 Time: SLP Start Time (ACUTE ONLY): 1606 SLP Stop Time (ACUTE ONLY): 1626 SLP Time Calculation (min) (ACUTE ONLY): 20 min  Past Medical History:  Past Medical History:  Diagnosis Date  . CAD (coronary artery disease)   . CHF (congestive heart failure) ([place])   . COVID-19   . Dementia ([place])   . Diabetes mellitus without complication ([place])   . H/O coronary angioplasty   . Hyperlipidemia   . Hypertension   . Peptic ulcer    Past Surgical History: History reviewed. No pertinent surgical history. HPI:  86 year old female who presented to the ED on 1/12 from [facility] with SOB and hypotension. PMH of CAD, CHF, dementia, HLD, HTN, PUD. Pt diagnosed with COVID-19 on 12/23. Code Sepsis called in ED. Pt admitted with mixed picture of COVID-19 and bacterial pneumonia.    Assessment / Plan / Recommendation Clinical Impression  Pt presents with an overall functional swallow. Oral mechanism exam unremarkable.  She was able to hold a cup with straw and bring to her lips, but needed some assistance with utensils.  She demonstrated adequate oral anticipation and attention, no overt issues with manipulating water and purees, swift swallow, no s/s of aspiration.  She had some difficulty managing the cracker, stating that it was "too hard" to chew.  For now, recommend beginning a dysphagia 2 diet, thin liquids; give meds whole in puree.  Assist with self-feeding (but pt is not a "feeder.") SLP will follow briefly for safety/toleration and diet advancement.   SLP Visit Diagnosis: Dysphagia, oropharyngeal phase (R13.12)    Aspiration Risk    minimal   Diet Recommendation   dysphagia 2, thin liquids\   Medication Administration: Whole meds with puree    Other  Recommendations Oral Care Recommendations: Oral care BID   Follow up Recommendations None       Frequency and Duration min 2x/week  1 week       Prognosis Prognosis for Safe Diet Advancement: Good      Swallow Study   General HPI: 86 year old female who presented to the ED on 1/12 from [facility] with SOB and hypotension. PMH of CAD, CHF, dementia, HLD, HTN, PUD. Pt diagnosed with COVID-19 on 12/23. Code Sepsis called in ED. Pt admitted with mixed picture of COVID-19 and bacterial pneumonia.  Type of Study: Bedside Swallow Evaluation Previous Swallow Assessment: no Diet Prior to this Study: Thin liquids Temperature Spikes Noted: No Respiratory Status: Room air History of Recent Intubation: No Behavior/Cognition: Alert;Cooperative;Confused Oral Cavity Assessment: Within Functional Limits Oral Cavity - Dentition: Dentures, top;Missing dentition Vision: Functional for self-feeding Self-Feeding Abilities: Needs assist Patient Positioning: Upright in bed Baseline Vocal Quality: Normal;Low vocal intensity Volitional Cough: Strong Volitional Swallow: Able to elicit    Oral/Motor/Sensory Function Overall Oral Motor/Sensory Function: Within functional limits   Ice Chips Ice chips: Within functional limits   Thin Liquid Thin Liquid: Within functional limits    Nectar Thick Nectar Thick Liquid: Not tested   Honey Thick Honey Thick Liquid: Not tested   Puree Puree: Within functional limits   Solid     Solid: Impaired Oral Phase Functional Implications: Oral residue(pt stated too difficult to chew)      [name] 10/28/2019,4:31 PM  Estill Bamberg L. Tivis Ringer, [place] Office number [phone]

## 2019-10-28 NOTE — Evaluation (Signed)
Physical Therapy Evaluation Patient Details Name: Chloe Ramsey MRN: [id number] DOB: [birth year] Today's Date: 10/28/2019   History of Present Illness  86 y.o. female with medical history significant of h/o CAD, CHF, dementia, HLD, HTN, PUD, presenting for evaluation via EMS from [facility] for evaluation of hypoxia, hypotension and hyperglycemia.  Per facility, patient was diagnosed with the coronavirus on 12/23.  Clinical Impression   Pt admitted with above diagnosis. Patient from [facility] long-term care facility. Per H&P, staff reported she ambulated independently with walker. Currently she is not following commands, is oriented to self only, and has low BPs with high RR. Limited bed level evaluation completed. She has experienced a significant functional decline, therefore will continue PT efforts. If cognition or medical status does not improve enough to allow participation in therapy, will discharge from PT. Pt currently with functional limitations due to the deficits listed below (see PT Problem List). Pt may benefit from skilled PT to increase their independence and safety with mobility to allow discharge to the venue listed below.       Follow Up Recommendations Supervision/Assistance - 24 hour;Other (comment)(return to long-term care)    Equipment Recommendations  None recommended by PT    Recommendations for Other Services       Precautions / Restrictions Precautions Precautions: Fall Precaution Comments: hypotension      Mobility  Bed Mobility Overal bed mobility: Needs Assistance Bed Mobility: Rolling Rolling: Total assist         General bed mobility comments: pt maintains eyes open when talking to her, but not following commands  Transfers                 General transfer comment: NT due to low BP in supine (MAP 61) with RR 32 and "belly breathing"  Ambulation/Gait                Stairs            Wheelchair Mobility    Modified  Rankin (Stroke Patients Only)       Balance                                             Pertinent Vitals/Pain Pain Assessment: Faces Faces Pain Scale: No hurt    Home Living Family/patient expects to be discharged to:: Skilled nursing facility                      Prior Function Level of Independence: Needs assistance   Gait / Transfers Assistance Needed: per chart, staff at SNF report she walks independently with walker           Hand Dominance        Extremity/Trunk Assessment   Upper Extremity Assessment Upper Extremity Assessment: Defer to OT evaluation    Lower Extremity Assessment Lower Extremity Assessment: Difficult to assess due to impaired cognition(PROM WFL)       Communication   Communication: Other (comment)(weak voice)  Cognition Arousal/Alertness: Lethargic Behavior During Therapy: Flat affect Overall Cognitive Status: Difficult to assess                                 General Comments: able to state her name; easily falls asleep      General Comments General comments (skin integrity,  edema, etc.): sats 95% on 1L with RR 30-32 throughout; Nurse tech reported pt was found sitting at EOB with gown off earlier this morning; assisted RN to place bed alarm sensor pad underneath patient     Exercises Other Exercises Other Exercises: PROM bil LEs   Assessment/Plan    PT Assessment Patient needs continued PT services  PT Problem List Decreased strength;Decreased activity tolerance;Decreased balance;Decreased mobility;Decreased cognition;Decreased knowledge of use of DME;Decreased safety awareness;Cardiopulmonary status limiting activity       PT Treatment Interventions DME instruction;Gait training;Functional mobility training;Therapeutic activities;Therapeutic exercise;Balance training;Cognitive remediation;Patient/family education    PT Goals (Current goals can be found in the Care Plan section)   Acute Rehab PT Goals Patient Stated Goal: unable to state PT Goal Formulation: Patient unable to participate in goal setting Time For Goal Achievement: 11/11/19 Potential to Achieve Goals: Poor    Frequency Min 2X/week   Barriers to discharge        Co-evaluation               AM-PAC PT "6 Clicks" Mobility  Outcome Measure Help needed turning from your back to your side while in a flat bed without using bedrails?: Total Help needed moving from lying on your back to sitting on the side of a flat bed without using bedrails?: Total Help needed moving to and from a bed to a chair (including a wheelchair)?: Total Help needed standing up from a chair using your arms (e.g., wheelchair or bedside chair)?: Total Help needed to walk in hospital room?: Total Help needed climbing 3-5 steps with a railing? : Total 6 Click Score: 6    End of Session Equipment Utilized During Treatment: Oxygen Activity Tolerance: Treatment limited secondary to medical complications (Comment) Patient left: in bed;with call bell/phone within reach;with bed alarm set   PT Visit Diagnosis: Difficulty in walking, not elsewhere classified (R26.2);Muscle weakness (generalized) (M62.81)    Time: [id number] PT Time Calculation (min) (ACUTE ONLY): 20 min   Charges:   PT Evaluation $PT Eval Low Complexity: 1 Low           Arby Barrette, PT Pager [phone]   Rexanne Mano 10/28/2019, 12:23 PM

## 2019-10-29 ENCOUNTER — Inpatient Hospital Stay (HOSPITAL_COMMUNITY): Payer: Medicare HMO

## 2019-10-29 ENCOUNTER — Encounter (HOSPITAL_COMMUNITY): Payer: Self-pay | Admitting: Internal Medicine

## 2019-10-29 LAB — CBC WITH DIFFERENTIAL/PLATELET
Abs Immature Granulocytes: 0.33 10*3/uL — ABNORMAL HIGH (ref 0.00–0.07)
Basophils Absolute: 0 10*3/uL (ref 0.0–0.1)
Basophils Relative: 0 %
Eosinophils Absolute: 0 10*3/uL (ref 0.0–0.5)
Eosinophils Relative: 0 %
HCT: 39.4 % (ref 36.0–46.0)
Hemoglobin: 13 g/dL (ref 12.0–15.0)
Immature Granulocytes: 1 %
Lymphocytes Relative: 4 %
Lymphs Abs: 1 10*3/uL (ref 0.7–4.0)
MCH: 31.1 pg (ref 26.0–34.0)
MCHC: 33 g/dL (ref 30.0–36.0)
MCV: 94.3 fL (ref 80.0–100.0)
Monocytes Absolute: 1.1 10*3/uL — ABNORMAL HIGH (ref 0.1–1.0)
Monocytes Relative: 4 %
Neutro Abs: 22.1 10*3/uL — ABNORMAL HIGH (ref 1.7–7.7)
Neutrophils Relative %: 91 %
Platelets: 234 10*3/uL (ref 150–400)
RBC: 4.18 MIL/uL (ref 3.87–5.11)
RDW: 13.3 % (ref 11.5–15.5)
WBC: 24.6 10*3/uL — ABNORMAL HIGH (ref 4.0–10.5)
nRBC: 0 % (ref 0.0–0.2)

## 2019-10-29 LAB — COMPREHENSIVE METABOLIC PANEL
ALT: 19 U/L (ref 0–44)
AST: 21 U/L (ref 15–41)
Albumin: 2.5 g/dL — ABNORMAL LOW (ref 3.5–5.0)
Alkaline Phosphatase: 95 U/L (ref 38–126)
Anion gap: 12 (ref 5–15)
BUN: 67 mg/dL — ABNORMAL HIGH (ref 8–23)
CO2: 21 mmol/L — ABNORMAL LOW (ref 22–32)
Calcium: 9.5 mg/dL (ref 8.9–10.3)
Chloride: 110 mmol/L (ref 98–111)
Creatinine, Ser: 1.32 mg/dL — ABNORMAL HIGH (ref 0.44–1.00)
GFR calc Af Amer: 42 mL/min — ABNORMAL LOW (ref >60)
GFR calc non Af Amer: 36 mL/min — ABNORMAL LOW (ref >60)
Glucose, Bld: 208 mg/dL — ABNORMAL HIGH (ref 70–99)
Potassium: 4.4 mmol/L (ref 3.5–5.1)
Sodium: 143 mmol/L (ref 135–145)
Total Bilirubin: 0.7 mg/dL (ref 0.3–1.2)
Total Protein: 5.6 g/dL — ABNORMAL LOW (ref 6.5–8.1)

## 2019-10-29 LAB — URINE CULTURE: Culture: NO GROWTH

## 2019-10-29 LAB — GLUCOSE, CAPILLARY
Glucose-Capillary: 165 mg/dL — ABNORMAL HIGH (ref 70–99)
Glucose-Capillary: 169 mg/dL — ABNORMAL HIGH (ref 70–99)
Glucose-Capillary: 185 mg/dL — ABNORMAL HIGH (ref 70–99)
Glucose-Capillary: 189 mg/dL — ABNORMAL HIGH (ref 70–99)
Glucose-Capillary: 192 mg/dL — ABNORMAL HIGH (ref 70–99)
Glucose-Capillary: 216 mg/dL — ABNORMAL HIGH (ref 70–99)
Glucose-Capillary: 299 mg/dL — ABNORMAL HIGH (ref 70–99)

## 2019-10-29 LAB — FERRITIN: Ferritin: 417 ng/mL — ABNORMAL HIGH (ref 11–307)

## 2019-10-29 LAB — D-DIMER, QUANTITATIVE: D-Dimer, Quant: 0.96 ug/mL-FEU — ABNORMAL HIGH (ref 0.00–0.50)

## 2019-10-29 LAB — C-REACTIVE PROTEIN: CRP: 12.9 mg/dL — ABNORMAL HIGH (ref <1.0)

## 2019-10-29 MED ORDER — SODIUM CHLORIDE 0.9 % IV SOLN
2.0000 g | Freq: Three times a day (TID) | INTRAVENOUS | Status: DC
  Administered 2019-10-29 – 2019-10-30 (×3): 2 g via INTRAVENOUS
  Filled 2019-10-29 (×5): qty 2

## 2019-10-29 MED ORDER — MIDODRINE HCL 5 MG PO TABS
2.5000 mg | ORAL_TABLET | Freq: Two times a day (BID) | ORAL | Status: DC
  Administered 2019-10-29 (×2): 2.5 mg via ORAL
  Filled 2019-10-29 (×2): qty 1

## 2019-10-29 MED ORDER — APIXABAN 2.5 MG PO TABS
2.5000 mg | ORAL_TABLET | Freq: Two times a day (BID) | ORAL | Status: DC
  Administered 2019-10-29 – 2019-10-31 (×5): 2.5 mg via ORAL
  Filled 2019-10-29 (×6): qty 1

## 2019-10-29 NOTE — Plan of Care (Signed)
  Problem: Education: Goal: Knowledge of risk factors and measures for prevention of condition will improve Outcome: Not Progressing   Problem: Coping: Goal: Psychosocial and spiritual needs will be supported Outcome: Not Progressing  Problem: Education: Goal: Knowledge of General Education information will improve Description: Including pain rating scale, medication(s)/side effects and non-pharmacologic comfort measures Outcome: Not Progressing   Problem: Health Behavior/Discharge Planning: Goal: Ability to manage health-related needs will improve Outcome: Not Progressing

## 2019-10-29 NOTE — Progress Notes (Signed)
Occupational Therapy Evaluation Patient Details Name: Chloe Ramsey MRN: [id number] DOB: 1930/09/27 Today's Date: 10/29/2019    History of Present Illness 86 y.o. female with medical history significant of h/o CAD, CHF, dementia, HLD, HTN, PUD, presenting for evaluation via EMS from [facility] for evaluation of hypoxia, hypotension and hyperglycemia.  Per facility, patient was diagnosed with the coronavirus on 12/23.   Clinical Impression   PTA pt resided at [facility]. Per staff, pt able to sit EOB and ambulate independently with a RW. Staff reports that pt is independent in self-feeding and grooming/hygiene tasks. Staff assist pt with dressing, toileting, and showering tasks. Pt currently requires min assist to total assist for self-care and functional transfer tasks. Pt able to sit edge of bed 5 min with variable mod to max assist to maintain balance. Noted continual right lateral lean despite max verbal and tactile cues. Pt able to follow simple one step instructions inconsistently. Pt on room air with SpO2 maintaining in 90s throughout. Pt assisted back to bed and positioned for comfort. RN updated. Pt demonstrates decreased ROM, strength, endurance, balance, sitting/standing tolerance, activity tolerance, and safety awareness impacting ability to complete self-care and functional transfer tasks. Recommend skilled OT services to address above deficits in order to promote function and prevent further decline.    Follow Up Recommendations  SNF(back to [facility])    Equipment Recommendations  None recommended by OT    Recommendations for Other Services       Precautions / Restrictions Precautions Precautions: Fall Restrictions Weight Bearing Restrictions: No      Mobility Bed Mobility Overal bed mobility: Needs Assistance Bed Mobility: Rolling;Sit to Supine;Supine to Sit Rolling: Total assist   Supine to sit: Total assist Sit to supine: Total assist   General bed mobility  comments: Pt agreeable to sitting EOB. Assist for trunk and BLEs. Despite verbal and tactile cues pt did not utilize BUEs.   Transfers                 General transfer comment: Not attempted this date due to safety concerns. Pt required mod to max assist to maintain seated balance for 5 min.     Balance Overall balance assessment: Needs assistance Sitting-balance support: Feet supported Sitting balance-Leahy Scale: Zero       Standing balance-Leahy Scale: (not attempted this date)                             ADL either performed or assessed with clinical judgement   ADL Overall ADL's : Needs assistance/impaired Eating/Feeding: Maximal assistance   Grooming: Minimal assistance;Bed level;Wash/dry face;Wash/dry hands;Cueing for sequencing   Upper Body Bathing: Bed level;Total assistance   Lower Body Bathing: Bed level;Total assistance   Upper Body Dressing : Bed level;Total assistance   Lower Body Dressing: Bed level;Total assistance   Toilet Transfer: Total assistance(bed level)   Toileting- Clothing Manipulation and Hygiene: Total assistance;Bed level       Functional mobility during ADLs: (did not occur this date.) General ADL Comments: Pt tolerated sitting EOB 5 min with mod to max assist to maintain balance. Standing/transfer tasks not attempted this date due to safety concerns.      Vision         Perception     Praxis      Pertinent Vitals/Pain Pain Assessment: Faces Faces Pain Scale: No hurt     Hand Dominance Right   Extremity/Trunk Assessment Upper Extremity  Assessment Upper Extremity Assessment: Difficult to assess due to impaired cognition   Lower Extremity Assessment Lower Extremity Assessment: Defer to PT evaluation       Communication Communication Communication: Other (comment)(Quiet voice)   Cognition Arousal/Alertness: Lethargic Behavior During Therapy: Flat affect Overall Cognitive Status: No family/caregiver  present to determine baseline cognitive functioning                                 General Comments: Pt able to state name. Pt easily falls asleep but opens eyes to name.    General Comments  Pt on room air with SpO2 maintaining in 90s throughout.    Exercises     Shoulder Instructions      Home Living Family/patient expects to be discharged to:: Skilled nursing facility                                        Prior Functioning/Environment Level of Independence: Needs assistance  Gait / Transfers Assistance Needed: Called and spoke to staff at [facility]. Pt ambulates independently with a RW.  ADL's / Homemaking Assistance Needed: Called and spoke to staff at [facility]. Pt is independent in self-feeding and grooming/hygiene tasks. Pt requies some assistance with dressing, toileting, and showering.             OT Problem List: Decreased strength;Decreased range of motion;Decreased activity tolerance;Impaired balance (sitting and/or standing);Decreased cognition;Decreased safety awareness;Cardiopulmonary status limiting activity      OT Treatment/Interventions: Self-care/ADL training;Therapeutic exercise;Neuromuscular education;Energy conservation;DME and/or AE instruction;Therapeutic activities;Patient/family education;Balance training    OT Goals(Current goals can be found in the care plan section) Acute Rehab OT Goals Patient Stated Goal: To agreeable to sitting EOB Time For Goal Achievement: 11/12/19 Potential to Achieve Goals: Fair ADL Goals Pt Will Perform Eating: with set-up;sitting Pt Will Perform Grooming: with set-up;sitting Pt Will Transfer to Toilet: with min assist;bedside commode Pt Will Perform Toileting - Clothing Manipulation and hygiene: with mod assist;sit to/from stand Additional ADL Goal #1: Pt to tolerate sitting EOB 10 min with supervision in preparation for ADLs. Additional ADL Goal #2: Pt to tolerate standing 3 min with  min assist, in preparation for ADLs.  OT Frequency: Min 2X/week   Barriers to D/C:            Co-evaluation              AM-PAC OT "6 Clicks" Daily Activity     Outcome Measure Help from another person eating meals?: Total Help from another person taking care of personal grooming?: A Little Help from another person toileting, which includes using toliet, bedpan, or urinal?: Total Help from another person bathing (including washing, rinsing, drying)?: Total Help from another person to put on and taking off regular upper body clothing?: Total Help from another person to put on and taking off regular lower body clothing?: Total 6 Click Score: 8   End of Session Nurse Communication: Mobility status  Activity Tolerance: Patient limited by fatigue;Patient limited by lethargy(Limited by cognition) Patient left: in bed;with call bell/phone within reach;with bed alarm set  OT Visit Diagnosis: Muscle weakness (generalized) (M62.81)                Time: [id number] OT Time Calculation (min): 33 min Charges:  OT General Charges $OT Visit: 1 Visit OT Evaluation $OT Eval  Moderate Complexity: 1 Mod OT Treatments $Therapeutic Activity: 8-22 mins  Mauri Brooklyn OTR/L [phone]   Mauri Brooklyn 10/29/2019, 11:47 AM

## 2019-10-29 NOTE — Progress Notes (Signed)
PROGRESS NOTE                                                                                                                                                                                                             Patient Demographics:    Chloe Ramsey, is a 86 y.o. female, DOB - [birth year], [id number]  Outpatient Primary MD for the patient is Hilbert Corrigan, MD   Admit date - 10/27/2019   LOS - 2  Chief Complaint  Patient presents with  . Shortness of Breath  . Hypotension       Brief Narrative: Patient is a 86 y.o. female with PMHx of dementia, chronic diastolic heart failure, CAD, HLD, HTN who is a resident of [facility]-apparently was diagnosed with Covid on 10/07/2019 presented to the ED on 1/12 with hypoxemia, hypotension-further revealed sepsis along with acute hypoxic respiratory failure secondary to Covid 19 with concurrent bacterial pneumonia and gram-negative bacteremia with acute kidney injury.  See below for further details.   Subjective:   No major issues overnight-she is pleasantly confused.  Afebrile.  Otherwise stable.   Assessment  & Plan :   Acute Hypoxic Resp Failure due to Covid 19 Viral pneumonia and concurrent bacterial pneumonia: Improved-on room air-continue steroids/remdesivir along with Azactam.  CRP downtrending.  Fever: afebrile  O2 requirements:  SpO2: 96 % O2 Flow Rate (L/min): 1 L/min   COVID-19 Labs: Recent Labs    10/27/19 1037 10/28/19 0740 10/29/19 0244  DDIMER 1.69* 0.80* 0.96*  FERRITIN 375* 342* 417*  LDH 218*  --   --   CRP 14.1* 18.3* 12.9*    No results found for: BNP  Recent Labs  Lab 10/27/19 1037  PROCALCITON 68.85    Lab Results  Component Value Date   SARSCOV2NAA POSITIVE (A) 10/27/2019     COVID-19 Medications: Steroids: 1/12>> Remdesivir: 1/12>>  Other medications: Diuretics:Euvolemic Antibiotics: Meropenem  1/12>> Levofloxacin 1/12 x 1 Vancomycin 1/12 x 1  Prone/Incentive Spirometry: encouraged  incentive spirometry use 3-4/hour  DVT Prophylaxis  :   Heparin - SCDs  Sepsis secondary to Klebsiella bacteremia/pneumonia: Sepsis physiology present on admission-sepsis physiology has resolved-blood pressure has now normalized-AKI significantly better.  Continue Azactam-await final culture results.  Suspect foci of bacteremia to be from the lungs-urine culture negative-abdominal exam is completely benign therefore doubt any GI/biliary  source.  AKI: Suspect hemodynamically mediated-no prior creatinine levels in chart-not sure if patient has underlying CKD at this point.  Renal ultrasound negative for hydronephrosis.  UA negative for proteinuria.  Thankfully creatinine continues to improve.  Follow.  Chronic diastolic heart failure: Euvolemic-follow  HTN: Hypotensive on presentation-BP but was still soft-continue to hold Coreg/amlodipine-we will change midodrine to twice daily dosing-and consider discontinuing it completely if blood pressure remains stable.    DM-2: CBG stable with SSI-follow and adjust  CBG (last 3)  Recent Labs    10/29/19 0417 10/29/19 0726 10/29/19 1124  GLUCAP 192* 169* 165*   Dementia: Remains pleasantly confused  Other issues: Not sure why this patient is on Eliquis as an outpatient-but I have resumed it.  Suspect may have A. fib history.  Tried calling his primary care practitioner's office yesterday-I could not get through to anyone in spite of being on hold for more than a few minutes.  Consults  :  None  Procedures  :  None  ABG: No results found for: PHART, PCO2ART, PO2ART, HCO3, TCO2, ACIDBASEDEF, O2SAT  Vent Settings: N/A  Condition -Guarded  Family Communication  :  Son updated over the phone  Code Status :  DNR  Diet :  Diet Order            DIET DYS 2 Room service appropriate? No; Fluid consistency: Thin  Diet effective now                Disposition Plan  :  Remain hospitalized  Barriers to discharge: Complete course of remdesivir-await final blood culture results  Antimicorbials  :    Anti-infectives (From admission, onward)   Start     Dose/Rate Route Frequency Ordered Stop   10/29/19 1400  aztreonam (AZACTAM) 2 g in sodium chloride 0.9 % 100 mL IVPB     2 g 200 mL/hr over 30 Minutes Intravenous Every 8 hours 10/29/19 1200     10/29/19 1300  levofloxacin (LEVAQUIN) IVPB 500 mg  Status:  Discontinued     500 mg 100 mL/hr over 60 Minutes Intravenous Every 48 hours 10/27/19 1214 10/27/19 1612   10/28/19 1400  aztreonam (AZACTAM) 1 g in sodium chloride 0.9 % 100 mL IVPB  Status:  Discontinued     1 g 200 mL/hr over 30 Minutes Intravenous Every 8 hours 10/28/19 1010 10/29/19 1200   10/28/19 1000  remdesivir 100 mg in sodium chloride 0.9 % 100 mL IVPB     100 mg 200 mL/hr over 30 Minutes Intravenous Daily 10/27/19 1430 11/01/19 0959   10/27/19 1630  vancomycin (VANCOREADY) IVPB 1500 mg/300 mL     1,500 mg 150 mL/hr over 120 Minutes Intravenous  Once 10/27/19 1615 10/27/19 1910   10/27/19 1615  meropenem (MERREM) 500 mg in sodium chloride 0.9 % 100 mL IVPB  Status:  Discontinued     500 mg 200 mL/hr over 30 Minutes Intravenous Every 12 hours 10/27/19 1528 10/28/19 1010   10/27/19 1614  vancomycin variable dose per unstable renal function (pharmacist dosing)  Status:  Discontinued      Does not apply See admin instructions 10/27/19 1615 10/28/19 0218   10/27/19 1430  remdesivir 200 mg in sodium chloride 0.9% 250 mL IVPB     200 mg 580 mL/hr over 30 Minutes Intravenous Once 10/27/19 1430 10/27/19 1843   10/27/19 1045  levofloxacin (LEVAQUIN) IVPB 750 mg     750 mg 100 mL/hr over 90 Minutes Intravenous  Once 10/27/19  1030 10/27/19 1317      Inpatient Medications  Scheduled Meds: . apixaban  2.5 mg Oral BID  . dexamethasone  6 mg Oral Q24H  . docusate sodium  100 mg Oral BID  . feeding supplement (ENSURE ENLIVE)   237 mL Oral TID BM  . fesoterodine  4 mg Oral Daily  . haloperidol  1 mg Oral TID  . insulin aspart  0-20 Units Subcutaneous Q4H  . insulin detemir  0.075 Units/kg Subcutaneous BID  . midodrine  2.5 mg Oral BID WC  . mirtazapine  7.5 mg Oral QHS   Continuous Infusions: . sodium chloride Stopped (10/27/19 2211)  . aztreonam    . remdesivir 100 mg in NS 100 mL Stopped (10/29/19 1144)   PRN Meds:.acetaminophen   Time Spent in minutes 35    See all Orders from today for further details   Oren Binet M.D on 10/29/2019 at 2:11 PM  To page go to [email or web address] - use universal password  [facility] -  Office  [phone]    Objective:   Vitals:   10/28/19 1925 10/29/19 0038 10/29/19 0727 10/29/19 1125  BP:  (!) 145/103 (!) 156/84 125/68  Pulse:  96 82 77  Resp:  20 20 20   Temp:  97.7 F (36.5 C) 97.8 F (36.6 C) 98 F (36.7 C)  TempSrc:  Axillary Axillary Axillary  SpO2: 93% 100% 98% 96%  Weight:      Height:        Wt Readings from Last 3 Encounters:  10/27/19 79.8 kg     Intake/Output Summary (Last 24 hours) at 10/29/2019 1411 Last data filed at 10/29/2019 1002 Gross per 24 hour  Intake 340 ml  Output 400 ml  Net -60 ml     Physical Exam Gen Exam:Awake-not in any distress-remains pleasantly confused HEENT:atraumatic, normocephalic Chest: B/L clear to auscultation anteriorly CVS:S1S2 regular Abdomen:soft non tender, non distended Extremities:no edema Neurology: But has significant generalized weakness Skin: no rash   Data Review:    CBC Recent Labs  Lab 10/27/19 1037 10/28/19 0730 10/29/19 0244  WBC 53.5* 26.3* 24.6*  HGB 12.7 12.0 13.0  HCT 39.6 35.6* 39.4  PLT 333 230 234  MCV 97.1 93.0 94.3  MCH 31.1 31.3 31.1  MCHC 32.1 33.7 33.0  RDW 13.0 13.2 13.3  LYMPHSABS 0.5* 0.8 1.0  MONOABS 1.1* 0.8 1.1*  EOSABS 0.0 0.1 0.0  BASOSABS 0.0 0.0 0.0    Chemistries  Recent Labs  Lab 10/27/19 1037 10/28/19 0740 10/29/19 0244  NA  137 142 143  K 4.8 3.9 4.4  CL 102 110 110  CO2 22 22 21*  GLUCOSE 376* 119* 208*  BUN 72* 71* 67*  CREATININE 3.05* 1.83* 1.32*  CALCIUM 9.5 9.0 9.5  AST 21 20 21   ALT 20 19 19   ALKPHOS 77 85 95  BILITOT 0.9 0.6 0.7   ------------------------------------------------------------------------------------------------------------------ Recent Labs    10/27/19 1050  TRIG 311*    No results found for: HGBA1C ------------------------------------------------------------------------------------------------------------------ No results for input(s): TSH, T4TOTAL, T3FREE, THYROIDAB in the last 72 hours.  Invalid input(s): FREET3 ------------------------------------------------------------------------------------------------------------------ Recent Labs    10/28/19 0740 10/29/19 0244  FERRITIN 342* 417*    Coagulation profile Recent Labs  Lab 10/27/19 1037  INR 1.6*    Recent Labs    10/28/19 0740 10/29/19 0244  DDIMER 0.80* 0.96*    Cardiac Enzymes No results for input(s): CKMB, TROPONINI, MYOGLOBIN in the last 168 hours.  Invalid input(s): CK ------------------------------------------------------------------------------------------------------------------ No  results found for: BNP  Micro Results Recent Results (from the past 240 hour(s))  Blood Culture (routine x 2)     Status: Abnormal (Preliminary result)   Collection Time: 10/27/19 10:32 AM   Specimen: BLOOD  Result Value Ref Range Status   Specimen Description BLOOD BLOOD RIGHT FOREARM  Final   Special Requests   Final    BOTTLES DRAWN AEROBIC AND ANAEROBIC Blood Culture adequate volume   Culture  Setup Time   Final    GRAM NEGATIVE RODS IN BOTH AEROBIC AND ANAEROBIC BOTTLES CRITICAL VALUE NOTED.  VALUE IS CONSISTENT WITH PREVIOUSLY REPORTED AND CALLED VALUE.    Culture (A)  Final    KLEBSIELLA PNEUMONIAE SUSCEPTIBILITIES TO FOLLOW Performed at [facility], [place] [address], [place], [place]  [address]    Report Status PENDING  Incomplete  Blood Culture (routine x 2)     Status: Abnormal (Preliminary result)   Collection Time: 10/27/19 10:36 AM   Specimen: BLOOD  Result Value Ref Range Status   Specimen Description BLOOD LEFT ANTECUBITAL  Final   Special Requests   Final    BOTTLES DRAWN AEROBIC AND ANAEROBIC Blood Culture results may not be optimal due to an inadequate volume of blood received in culture bottles   Culture  Setup Time   Final    GRAM NEGATIVE RODS IN BOTH AEROBIC AND ANAEROBIC BOTTLES Organism ID to follow CRITICAL RESULT CALLED TO, READ BACK BY AND VERIFIED WITH: K.AMEND,PHARMD AT 0205 ON 10/28/19 BY G.MCADOO Performed at [facility], [place] [address] [place], [place] [address]    Culture KLEBSIELLA PNEUMONIAE (A)  Final   Report Status PENDING  Incomplete  Blood Culture ID Panel (Reflexed)     Status: Abnormal   Collection Time: 10/27/19 10:36 AM  Result Value Ref Range Status   Enterococcus species NOT DETECTED NOT DETECTED Final   Listeria monocytogenes NOT DETECTED NOT DETECTED Final   Staphylococcus species NOT DETECTED NOT DETECTED Final   Staphylococcus aureus (BCID) NOT DETECTED NOT DETECTED Final   Streptococcus species NOT DETECTED NOT DETECTED Final   Streptococcus agalactiae NOT DETECTED NOT DETECTED Final   Streptococcus pneumoniae NOT DETECTED NOT DETECTED Final   Streptococcus pyogenes NOT DETECTED NOT DETECTED Final   Acinetobacter baumannii NOT DETECTED NOT DETECTED Final   Enterobacteriaceae species DETECTED (A) NOT DETECTED Final    Comment: Enterobacteriaceae represent a large family of gram-negative bacteria, not a single organism. CRITICAL RESULT CALLED TO, READ BACK BY AND VERIFIED WITH: K.AMEND,PHARMD AT 0205 ON 10/28/19 BY G.MCADOO    Enterobacter cloacae complex NOT DETECTED NOT DETECTED Final   Escherichia coli NOT DETECTED NOT DETECTED Final   Klebsiella oxytoca NOT DETECTED NOT DETECTED Final   Klebsiella pneumoniae  DETECTED (A) NOT DETECTED Final    Comment: CRITICAL RESULT CALLED TO, READ BACK BY AND VERIFIED WITH: K.AMEND,PHARMD AT 0205 ON 10/28/19 BY G.MCADOO    Proteus species NOT DETECTED NOT DETECTED Final   Serratia marcescens NOT DETECTED NOT DETECTED Final   Carbapenem resistance NOT DETECTED NOT DETECTED Final   Haemophilus influenzae NOT DETECTED NOT DETECTED Final   Neisseria meningitidis NOT DETECTED NOT DETECTED Final   Pseudomonas aeruginosa NOT DETECTED NOT DETECTED Final   Candida albicans NOT DETECTED NOT DETECTED Final   Candida glabrata NOT DETECTED NOT DETECTED Final   Candida krusei NOT DETECTED NOT DETECTED Final   Candida parapsilosis NOT DETECTED NOT DETECTED Final   Candida tropicalis NOT DETECTED NOT DETECTED Final  Comment: Performed at [facility], [place] [address], [place], [place] [address]  Respiratory Panel by RT PCR (Flu A&B, Covid) - Nasopharyngeal Swab     Status: Abnormal   Collection Time: 10/27/19 11:43 AM   Specimen: Nasopharyngeal Swab  Result Value Ref Range Status   SARS Coronavirus 2 by RT PCR POSITIVE (A) NEGATIVE Final    Comment: RESULT CALLED TO, READ BACK BY AND VERIFIED WITH: Alesia Banda RN 13:25 10/27/19 ([email or web address]) (NOTE) SARS-CoV-2 target nucleic acids are DETECTED. SARS-CoV-2 RNA is generally detectable in upper respiratory specimens  during the acute phase of infection. Positive results are indicative of the presence of the identified virus, but do not rule out bacterial infection or co-infection with other pathogens not detected by the test. Clinical correlation with patient history and other diagnostic information is necessary to determine patient infection status. The expected result is Negative. Fact Sheet for Patients:  PinkCheek.be Fact Sheet for Healthcare Providers: GravelBags.it This test is not yet approved or cleared by the [place] FDA and  has been authorized  for detection and/or diagnosis of SARS-CoV-2 by FDA under an Emergency Use Authorization (EUA).  This EUA will remain in effect (meaning this test can be used)  for the duration of  the COVID-19 declaration under Section 564(b)(1) of the Act, 21 U.S.C. section 360bbb-3(b)(1), unless the authorization is terminated or revoked sooner.    Influenza A by PCR NEGATIVE NEGATIVE Final   Influenza B by PCR NEGATIVE NEGATIVE Final    Comment: (NOTE) The Xpert Xpress SARS-CoV-2/FLU/RSV assay is intended as an aid in  the diagnosis of influenza from Nasopharyngeal swab specimens and  should not be used as a sole basis for treatment. Nasal washings and  aspirates are unacceptable for Xpert Xpress SARS-CoV-2/FLU/RSV  testing. Fact Sheet for Patients: PinkCheek.be Fact Sheet for Healthcare Providers: GravelBags.it This test is not yet approved or cleared by the [place] FDA and  has been authorized for detection and/or diagnosis of SARS-CoV-2 by  FDA under an Emergency Use Authorization (EUA). This EUA will remain  in effect (meaning this test can be used) for the duration of the  Covid-19 declaration under Section 564(b)(1) of the Act, 21  U.S.C. section 360bbb-3(b)(1), unless the authorization is  terminated or revoked. Performed at [facility], [place] [address] [place], [place] [address]   Urine culture     Status: None   Collection Time: 10/28/19  1:08 PM   Specimen: In/Out Cath Urine  Result Value Ref Range Status   Specimen Description   Final    IN/OUT CATH URINE Performed at [place] [address], [place], [place] [address]    Special Requests   Final    NONE Performed at [facility], [place] [address], [place], [place] [address]    Culture   Final    NO GROWTH Performed at [facility], [place] [address] [place], [place] [address]    Report Status 10/29/2019 FINAL   Final    Radiology Reports NM Pulmonary Perfusion  Result Date: 10/27/2019 CLINICAL DATA:  Hypoxia.  Elevated D-dimer EXAM: NUCLEAR MEDICINE PERFUSION LUNG SCAN TECHNIQUE: Perfusion images were obtained in multiple projections after intravenous injection of radiopharmaceutical. Ventilation scans intentionally deferred if perfusion scan and chest x-ray adequate for interpretation during COVID 19 epidemic. Views: Anterior, posterior, left lateral, right lateral, RPO, LPO, RAO, LAO RADIOPHARMACEUTICALS:  1.54 mCi Tc-64m MAA IV COMPARISON:  Chest radiograph October 27, 2019  FINDINGS: Radiotracer uptake is homogeneous and symmetric bilaterally. No perfusion defects evident. IMPRESSION: No demonstrable perfusion defects. Very low probability of pulmonary embolus. Electronically Signed   By: Lowella Grip III M.D.   On: 10/27/2019 16:28   US Renal  Result Date: 10/28/2019 CLINICAL DATA:  Acute kidney injury, history hypertension, coronary artery disease, CHF, diabetes mellitus, COVID-19 EXAM: RENAL / URINARY TRACT ULTRASOUND COMPLETE COMPARISON:  None FINDINGS: Right Kidney: Renal measurements: 11.4 x 4.6 x 5.5 cm = volume: 152 mL. Significant cortical thinning. Mildly increased cortical echogenicity. Hypoechoic nodule likely small cyst at upper pole 16 x 16 x 14 mm. Additional tiny hypoechoic nodule at inferior pole likely an additional exophytic cyst 10 x 13 x 9 mm, containing a few scattered internal echoes. No additional mass or hydronephrosis. No shadowing calculi. Left Kidney: Renal measurements: 10.6 x 5.6 x 4.4 cm = volume: 134 mL. Cortical thinning. Increased cortical echogenicity. Exophytic cyst at inferior pole 1.6 x 1.6 x 1.8 cm. Additional exophytic cyst at upper pole, simple character, 2.6 x 3.1 x 2.9 cm. No additional mass or hydronephrosis. No shadowing calculi. Bladder: Appears normal for degree of bladder distention. Other: N/A IMPRESSION: Cortical atrophy and medical renal disease changes  of both kidneys. BILATERAL renal cysts. No evidence of hydronephrosis. Electronically Signed   By: Lavonia Dana M.D.   On: 10/28/2019 16:45   Portable chest 1 View  Result Date: 10/29/2019 CLINICAL DATA:  Shortness of breath, pneumonia. EXAM: PORTABLE CHEST 1 VIEW COMPARISON:  October 27, 2019. FINDINGS: Stable cardiomegaly. Atherosclerosis of thoracic aorta is noted. No pneumothorax is noted. Stable bilateral lung opacities are noted concerning for multifocal pneumonia. Bony thorax is unremarkable. IMPRESSION: Aortic atherosclerosis. Stable bilateral lung opacities are noted concerning for multifocal pneumonia. Electronically Signed   By: Marijo Conception M.D.   On: 10/29/2019 08:21   DG Chest Port 1 View  Result Date: 10/27/2019 CLINICAL DATA:  COVID positive.  Hypoxia EXAM: PORTABLE CHEST 1 VIEW COMPARISON:  None FINDINGS: Bibasilar infiltrates compatible with pneumonia. Negative for heart failure or effusion. Atherosclerotic aorta. Chronic appearing rib fractures on the right. Degenerative change in the right shoulder. IMPRESSION: Bibasilar infiltrates consistent with pneumonia Electronically Signed   By: Franchot Gallo M.D.   On: 10/27/2019 11:07   ECHOCARDIOGRAM LIMITED  Result Date: 10/27/2019   ECHOCARDIOGRAM REPORT   Patient Name:   Chloe Ramsey Date of Exam: 10/27/2019 Medical Rec #:  [id number]  Height:       66.0 in Accession #:    [id number] Weight:       176.0 lb Date of Birth:  Nov 04, 1929  BSA:          1.89 m Patient Age:    31 years   BP:           95/44 mmHg Patient Gender: F          HR:           90 bpm. Exam Location:  Inpatient Procedure: Limited Echo STAT ECHO Indications:    Congestive heart Failure I50.31  History:        Patient has no prior history of Echocardiogram examinations.                 CHF, CAD, Covid-19 Positive; Risk Factors:Hypertension,                 Dyslipidemia and Diabetes.  Sonographer:    Mikki Santee RDCS (AE) Referring Phys: [place]  1. Left ventricular ejection fraction, by visual estimation, is 60 to 65%. The left ventricle has normal function. There is mildly increased left ventricular hypertrophy.  2. Elevated left atrial pressure.  3. Left ventricular diastolic parameters are consistent with Grade II diastolic dysfunction (pseudonormalization).  4. The left ventricle has no regional wall motion abnormalities.  5. Global right ventricle has normal systolic function.The right ventricular size is normal. No increase in right ventricular wall thickness.  6. Left atrial size was mildly dilated.  7. Right atrial size was normal.  8. Mild mitral annular calcification.  9. The mitral valve is normal in structure. No evidence of mitral valve regurgitation. No evidence of mitral stenosis. 10. The tricuspid valve is normal in structure. 11. The aortic valve is normal in structure. Aortic valve regurgitation is not visualized. Mild aortic valve sclerosis without stenosis. 12. The pulmonic valve was normal in structure. Pulmonic valve regurgitation is not visualized. 13. Normal pulmonary artery systolic pressure. 14. The inferior vena cava is normal in size with greater than 50% respiratory variability, suggesting right atrial pressure of 3 mmHg. FINDINGS  Left Ventricle: Left ventricular ejection fraction, by visual estimation, is 60 to 65%. The left ventricle has normal function. The left ventricle has no regional wall motion abnormalities. There is mildly increased left ventricular hypertrophy. Concentric left ventricular hypertrophy. Left ventricular diastolic parameters are consistent with Grade II diastolic dysfunction (pseudonormalization). Elevated left atrial pressure. Right Ventricle: The right ventricular size is normal. No increase in right ventricular wall thickness. Global RV systolic function is has normal systolic function. The tricuspid regurgitant velocity is 2.52 m/s, and with an assumed right atrial pressure  of 3 mmHg,  the estimated right ventricular systolic pressure is normal at 28.4 mmHg. Left Atrium: Left atrial size was mildly dilated. Right Atrium: Right atrial size was normal in size Pericardium: There is no evidence of pericardial effusion. Mitral Valve: The mitral valve is normal in structure. Mild mitral annular calcification. No evidence of mitral valve regurgitation. No evidence of mitral valve stenosis by observation. Tricuspid Valve: The tricuspid valve is normal in structure. Tricuspid valve regurgitation is trivial. Aortic Valve: The aortic valve is normal in structure. Aortic valve regurgitation is not visualized. Mild aortic valve sclerosis is present, with no evidence of aortic valve stenosis. Pulmonic Valve: The pulmonic valve was normal in structure. Pulmonic valve regurgitation is not visualized. Pulmonic regurgitation is not visualized. Aorta: The aortic root, ascending aorta and aortic arch are all structurally normal, with no evidence of dilitation or obstruction. Venous: The inferior vena cava is normal in size with greater than 50% respiratory variability, suggesting right atrial pressure of 3 mmHg. IAS/Shunts: No atrial level shunt detected by color flow Doppler. There is no evidence of a patent foramen ovale. No ventricular septal defect is seen or detected. There is no evidence of an atrial septal defect.  LEFT VENTRICLE PLAX 2D LVIDd:         3.30 cm Diastology LVIDs:         2.40 cm LV e' lateral:   4.79 cm/s LV PW:         1.20 cm LV E/e' lateral: 15.4 LV IVS:        1.20 cm LV e' medial:    5.44 cm/s LV SV:         24 ml   LV E/e' medial:  13.5 LV SV Index:   12.30  LEFT ATRIUM           Index  RIGHT ATRIUM           Index LA diam:      3.10 cm 1.64 cm/m  RA Area:     15.50 cm LA Vol (A2C): 37.1 ml 19.59 ml/m RA Volume:   40.20 ml  21.22 ml/m LA Vol (A4C): 65.1 ml 34.37 ml/m   AORTA Ao Root diam: 3.30 cm MITRAL VALVE                        TRICUSPID VALVE MV Area (PHT): 2.80 cm              TR Peak grad:   25.4 mmHg MV PHT:        78.59 msec           TR Vmax:        252.00 cm/s MV Decel Time: 271 msec MV E velocity: 73.70 cm/s 103 cm/s MV A velocity: 68.60 cm/s 70.3 cm/s MV E/A ratio:  1.07       1.5  Mihai Croitoru MD Electronically signed by Sanda Klein MD Signature Date/Time: 10/27/2019/6:16:16 PM    Final

## 2019-10-29 NOTE — Progress Notes (Signed)
PHARMACY NOTE:  ANTIMICROBIAL RENAL DOSAGE ADJUSTMENT  Current antimicrobial regimen includes a mismatch between antimicrobial dosage and estimated renal function.  As per policy approved by the Pharmacy [employer] Therapeutics and Medical Executive Committees, the antimicrobial dosage will be adjusted accordingly.  Current antimicrobial dosage:  Aztreonam 1gm q8  Indication: Kleb pneumo bacteremia  Renal Function:   Estimated Creatinine Clearance: 32 mL/min (A) (by C-G formula based on SCr of 1.32 mg/dL (H)). []      On intermittent HD, scheduled: []      On CRRT    Antimicrobial dosage has been changed to:  Aztreonam 2gm q8   Additional Comments: improving renal function, may consider comfort care, await GOC.   Thank you for allowing pharmacy to be a part of this patient's care.  Minda Ditto PharmD 10/29/2019 12:02 PM

## 2019-10-29 NOTE — Progress Notes (Signed)
Patient is resting in bed, confused, reoriented to situation, made comfortable.  Blood sugars obtained, vitals taken, they are stable. No acute changes in her condition at this moment, will continue to monitor during the shift.

## 2019-10-30 LAB — CBC WITH DIFFERENTIAL/PLATELET
Abs Immature Granulocytes: 0.16 10*3/uL — ABNORMAL HIGH (ref 0.00–0.07)
Basophils Absolute: 0 10*3/uL (ref 0.0–0.1)
Basophils Relative: 0 %
Eosinophils Absolute: 0 10*3/uL (ref 0.0–0.5)
Eosinophils Relative: 0 %
HCT: 36.7 % (ref 36.0–46.0)
Hemoglobin: 12 g/dL (ref 12.0–15.0)
Immature Granulocytes: 1 %
Lymphocytes Relative: 8 %
Lymphs Abs: 1.2 10*3/uL (ref 0.7–4.0)
MCH: 30.9 pg (ref 26.0–34.0)
MCHC: 32.7 g/dL (ref 30.0–36.0)
MCV: 94.6 fL (ref 80.0–100.0)
Monocytes Absolute: 0.9 10*3/uL (ref 0.1–1.0)
Monocytes Relative: 6 %
Neutro Abs: 13.7 10*3/uL — ABNORMAL HIGH (ref 1.7–7.7)
Neutrophils Relative %: 85 %
Platelets: 229 10*3/uL (ref 150–400)
RBC: 3.88 MIL/uL (ref 3.87–5.11)
RDW: 13.9 % (ref 11.5–15.5)
WBC: 16.1 10*3/uL — ABNORMAL HIGH (ref 4.0–10.5)
nRBC: 0 % (ref 0.0–0.2)

## 2019-10-30 LAB — D-DIMER, QUANTITATIVE: D-Dimer, Quant: 0.53 ug/mL-FEU — ABNORMAL HIGH (ref 0.00–0.50)

## 2019-10-30 LAB — COMPREHENSIVE METABOLIC PANEL
ALT: 17 U/L (ref 0–44)
AST: 23 U/L (ref 15–41)
Albumin: 2.2 g/dL — ABNORMAL LOW (ref 3.5–5.0)
Alkaline Phosphatase: 76 U/L (ref 38–126)
Anion gap: 9 (ref 5–15)
BUN: 60 mg/dL — ABNORMAL HIGH (ref 8–23)
CO2: 25 mmol/L (ref 22–32)
Calcium: 9.8 mg/dL (ref 8.9–10.3)
Chloride: 110 mmol/L (ref 98–111)
Creatinine, Ser: 1.18 mg/dL — ABNORMAL HIGH (ref 0.44–1.00)
GFR calc Af Amer: 48 mL/min — ABNORMAL LOW (ref >60)
GFR calc non Af Amer: 41 mL/min — ABNORMAL LOW (ref >60)
Glucose, Bld: 207 mg/dL — ABNORMAL HIGH (ref 70–99)
Potassium: 4.1 mmol/L (ref 3.5–5.1)
Sodium: 144 mmol/L (ref 135–145)
Total Bilirubin: 0.5 mg/dL (ref 0.3–1.2)
Total Protein: 5 g/dL — ABNORMAL LOW (ref 6.5–8.1)

## 2019-10-30 LAB — GLUCOSE, CAPILLARY
Glucose-Capillary: 132 mg/dL — ABNORMAL HIGH (ref 70–99)
Glucose-Capillary: 173 mg/dL — ABNORMAL HIGH (ref 70–99)
Glucose-Capillary: 195 mg/dL — ABNORMAL HIGH (ref 70–99)
Glucose-Capillary: 229 mg/dL — ABNORMAL HIGH (ref 70–99)
Glucose-Capillary: 243 mg/dL — ABNORMAL HIGH (ref 70–99)
Glucose-Capillary: 250 mg/dL — ABNORMAL HIGH (ref 70–99)
Glucose-Capillary: 333 mg/dL — ABNORMAL HIGH (ref 70–99)
Glucose-Capillary: 95 mg/dL (ref 70–99)

## 2019-10-30 LAB — CULTURE, BLOOD (ROUTINE X 2): Special Requests: ADEQUATE

## 2019-10-30 LAB — FERRITIN: Ferritin: 234 ng/mL (ref 11–307)

## 2019-10-30 LAB — C-REACTIVE PROTEIN: CRP: 6.3 mg/dL — ABNORMAL HIGH (ref <1.0)

## 2019-10-30 MED ORDER — CIPROFLOXACIN IN D5W 400 MG/200ML IV SOLN
400.0000 mg | Freq: Two times a day (BID) | INTRAVENOUS | Status: DC
  Administered 2019-10-30 – 2019-10-31 (×2): 400 mg via INTRAVENOUS
  Filled 2019-10-30 (×4): qty 200

## 2019-10-30 NOTE — Progress Notes (Signed)
   10/30/19 1124  Family/Significant Other Communication  Family/Significant Other Update Called (Son Richard. No answer)

## 2019-10-30 NOTE — Progress Notes (Signed)
PROGRESS NOTE                                                                                                                                                                                                             Patient Demographics:    Chloe Ramsey, is a 86 y.o. female, DOB - [birth year], [id number]  Outpatient Primary MD for the patient is Hilbert Corrigan, MD   Admit date - 10/27/2019   LOS - 3  Chief Complaint  Patient presents with   Shortness of Breath   Hypotension       Brief Narrative: Patient is a 86 y.o. female with PMHx of dementia, chronic diastolic heart failure, CAD, HLD, HTN who is a resident of [facility]-apparently was diagnosed with Covid on 10/07/2019 presented to the ED on 1/12 with hypoxemia, hypotension-further revealed sepsis along with acute hypoxic respiratory failure secondary to Covid 19 with concurrent bacterial pneumonia and gram-negative bacteremia with acute kidney injury.  See below for further details.   Subjective:   Pleasantly confused-lying comfortably in bed-follows some commands.   Assessment  & Plan :   Acute Hypoxic Resp Failure due to Covid 19 Viral pneumonia and concurrent bacterial pneumonia: Improved-on room air-continue steroids/remdesivir along with Azactam.  CRP downtrending.  Fever: afebrile  O2 requirements:  SpO2: 94 % O2 Flow Rate (L/min): 3 L/min   COVID-19 Labs: Recent Labs    10/28/19 0740 10/29/19 0244 10/30/19 0119  DDIMER 0.80* 0.96* 0.53*  FERRITIN 342* 417* 234  CRP 18.3* 12.9* 6.3*    No results found for: BNP  Recent Labs  Lab 10/27/19 1037  PROCALCITON 68.85    Lab Results  Component Value Date   SARSCOV2NAA POSITIVE (A) 10/27/2019     COVID-19 Medications: Steroids: 1/12>> Remdesivir: 1/12>>  Other medications: Diuretics:Euvolemic Antibiotics: Meropenem 1/12>> Levofloxacin 1/12 x 1 Vancomycin 1/12 x  1  Prone/Incentive Spirometry: encouraged  incentive spirometry use 3-4/hour  DVT Prophylaxis  :   Heparin  Sepsis secondary to Klebsiella bacteremia/pneumonia: Sepsis physiology present on admission-sepsis physiology has resolved-blood pressure has now normalized-AKI significantly better.  Continue Azactam-await final culture results.  Suspect foci of bacteremia to be from the lungs-urine culture negative-abdominal exam is completely benign therefore doubt any GI/biliary source.  AKI: Suspect hemodynamically mediated-no prior creatinine levels in chart-not sure if patient has underlying  CKD at this point.  Renal ultrasound negative for hydronephrosis.  UA negative for proteinuria.  Thankfully creatinine continues to improve.  Follow.  Chronic diastolic heart failure: Euvolemic-follow  HTN: Hypotensive on presentation-BP but was still soft-hence started on midodrine but now BP seems to be stable-stop midodrine-suspect could resume Coreg/amlodipine in a day or so.  Follow closely.     DM-2: CBG stable with SSI-follow and adjust  CBG (last 3)  Recent Labs    10/30/19 0348 10/30/19 0759 10/30/19 1108  GLUCAP 132* 95 243*   Dementia: Remains pleasantly confused  Other issues: Not sure why this patient is on Eliquis as an outpatient-but I have resumed it.  Suspect may have A. fib history.  Tried calling his primary care practitioner's office on 1/13-I could not get through to anyone in spite of being on hold for more than a few minutes.  Consults  :  None  Procedures  :  None  ABG: No results found for: PHART, PCO2ART, PO2ART, HCO3, TCO2, ACIDBASEDEF, O2SAT  Vent Settings: N/A  Condition -Guarded  Family Communication  :  Son updated over the phone 1/15  Code Status :  DNR  Diet :  Diet Order            DIET DYS 2 Room service appropriate? No; Fluid consistency: Thin  Diet effective now               Disposition Plan  :  Remain hospitalized-hopefully back to SNF in the  next day or so-message sent to social work.  Barriers to discharge: Complete course of remdesivir-await final blood culture results  Antimicorbials  :    Anti-infectives (From admission, onward)   Start     Dose/Rate Route Frequency Ordered Stop   10/29/19 1400  aztreonam (AZACTAM) 2 g in sodium chloride 0.9 % 100 mL IVPB     2 g 200 mL/hr over 30 Minutes Intravenous Every 8 hours 10/29/19 1200     10/29/19 1300  levofloxacin (LEVAQUIN) IVPB 500 mg  Status:  Discontinued     500 mg 100 mL/hr over 60 Minutes Intravenous Every 48 hours 10/27/19 1214 10/27/19 1612   10/28/19 1400  aztreonam (AZACTAM) 1 g in sodium chloride 0.9 % 100 mL IVPB  Status:  Discontinued     1 g 200 mL/hr over 30 Minutes Intravenous Every 8 hours 10/28/19 1010 10/29/19 1200   10/28/19 1000  remdesivir 100 mg in sodium chloride 0.9 % 100 mL IVPB     100 mg 200 mL/hr over 30 Minutes Intravenous Daily 10/27/19 1430 11/01/19 0959   10/27/19 1630  vancomycin (VANCOREADY) IVPB 1500 mg/300 mL     1,500 mg 150 mL/hr over 120 Minutes Intravenous  Once 10/27/19 1615 10/27/19 1910   10/27/19 1615  meropenem (MERREM) 500 mg in sodium chloride 0.9 % 100 mL IVPB  Status:  Discontinued     500 mg 200 mL/hr over 30 Minutes Intravenous Every 12 hours 10/27/19 1528 10/28/19 1010   10/27/19 1614  vancomycin variable dose per unstable renal function (pharmacist dosing)  Status:  Discontinued      Does not apply See admin instructions 10/27/19 1615 10/28/19 0218   10/27/19 1430  remdesivir 200 mg in sodium chloride 0.9% 250 mL IVPB     200 mg 580 mL/hr over 30 Minutes Intravenous Once 10/27/19 1430 10/27/19 1843   10/27/19 1045  levofloxacin (LEVAQUIN) IVPB 750 mg     750 mg 100 mL/hr over 90 Minutes Intravenous  Once 10/27/19 1030 10/27/19 1317      Inpatient Medications  Scheduled Meds:  apixaban  2.5 mg Oral BID   dexamethasone  6 mg Oral Q24H   docusate sodium  100 mg Oral BID   feeding supplement (ENSURE ENLIVE)   237 mL Oral TID BM   fesoterodine  4 mg Oral Daily   haloperidol  1 mg Oral TID   insulin aspart  0-20 Units Subcutaneous Q4H   insulin detemir  0.075 Units/kg Subcutaneous BID   mirtazapine  7.5 mg Oral QHS   Continuous Infusions:  sodium chloride Stopped (10/27/19 2211)   aztreonam 2 g (10/30/19 [id number])   remdesivir 100 mg in NS 100 mL 100 mg (10/30/19 0827)   PRN Meds:.acetaminophen   Time Spent in minutes 35    See all Orders from today for further details   Oren Binet M.D on 10/30/2019 at 11:45 AM  To page go to [email or web address] - use universal password  [facility] -  Office  [phone]    Objective:   Vitals:   10/30/19 0349 10/30/19 0700 10/30/19 0800 10/30/19 0801  BP: 132/78  (!) 160/90   Pulse: 72  77   Resp: 14  (!) 22   Temp: 98.4 F (36.9 C)  (!) 97.5 F (36.4 C)   TempSrc: Axillary  Axillary   SpO2: 100% 99% 100% 94%  Weight:      Height:        Wt Readings from Last 3 Encounters:  10/27/19 79.8 kg     Intake/Output Summary (Last 24 hours) at 10/30/2019 1145 Last data filed at 10/30/2019 0827 Gross per 24 hour  Intake 360 ml  Output 400 ml  Net -40 ml     Physical Exam Gen Exam: Pleasantly confused but not in any distress. HEENT:atraumatic, normocephalic Chest: B/L clear to auscultation anteriorly CVS:S1S2 regular Abdomen:soft non tender, non distended Extremities:no edema Neurology: Non focal-has generalized weakness. Skin: no rash   Data Review:    CBC Recent Labs  Lab 10/27/19 1037 10/28/19 0730 10/29/19 0244 10/30/19 0119  WBC 53.5* 26.3* 24.6* 16.1*  HGB 12.7 12.0 13.0 12.0  HCT 39.6 35.6* 39.4 36.7  PLT 333 230 234 229  MCV 97.1 93.0 94.3 94.6  MCH 31.1 31.3 31.1 30.9  MCHC 32.1 33.7 33.0 32.7  RDW 13.0 13.2 13.3 13.9  LYMPHSABS 0.5* 0.8 1.0 1.2  MONOABS 1.1* 0.8 1.1* 0.9  EOSABS 0.0 0.1 0.0 0.0  BASOSABS 0.0 0.0 0.0 0.0    Chemistries  Recent Labs  Lab 10/27/19 1037 10/28/19 0740  10/29/19 0244 10/30/19 0119  NA 137 142 143 144  K 4.8 3.9 4.4 4.1  CL 102 110 110 110  CO2 22 22 21* 25  GLUCOSE 376* 119* 208* 207*  BUN 72* 71* 67* 60*  CREATININE 3.05* 1.83* 1.32* 1.18*  CALCIUM 9.5 9.0 9.5 9.8  AST 21 20 21 23   ALT 20 19 19 17   ALKPHOS 77 85 95 76  BILITOT 0.9 0.6 0.7 0.5   ------------------------------------------------------------------------------------------------------------------ No results for input(s): CHOL, HDL, LDLCALC, TRIG, CHOLHDL, LDLDIRECT in the last 72 hours.  No results found for: HGBA1C ------------------------------------------------------------------------------------------------------------------ No results for input(s): TSH, T4TOTAL, T3FREE, THYROIDAB in the last 72 hours.  Invalid input(s): FREET3 ------------------------------------------------------------------------------------------------------------------ Recent Labs    10/29/19 0244 10/30/19 0119  FERRITIN 417* 234    Coagulation profile Recent Labs  Lab 10/27/19 1037  INR 1.6*    Recent Labs    10/29/19 0244 10/30/19 0119  DDIMER 0.96* 0.53*    Cardiac Enzymes No results for input(s): CKMB, TROPONINI, MYOGLOBIN in the last 168 hours.  Invalid input(s): CK ------------------------------------------------------------------------------------------------------------------ No results found for: BNP  Micro Results Recent Results (from the past 240 hour(s))  Blood Culture (routine x 2)     Status: Abnormal   Collection Time: 10/27/19 10:32 AM   Specimen: BLOOD  Result Value Ref Range Status   Specimen Description BLOOD BLOOD RIGHT FOREARM  Final   Special Requests   Final    BOTTLES DRAWN AEROBIC AND ANAEROBIC Blood Culture adequate volume   Culture  Setup Time   Final    GRAM NEGATIVE RODS IN BOTH AEROBIC AND ANAEROBIC BOTTLES CRITICAL VALUE NOTED.  VALUE IS CONSISTENT WITH PREVIOUSLY REPORTED AND CALLED VALUE.    Culture (A)  Final    KLEBSIELLA  PNEUMONIAE SUSCEPTIBILITIES PERFORMED ON PREVIOUS CULTURE WITHIN THE LAST 5 DAYS. Performed at [facility], [place] [address], [place], [place] [address]    Report Status 10/30/2019 FINAL  Final  Blood Culture (routine x 2)     Status: Abnormal   Collection Time: 10/27/19 10:36 AM   Specimen: BLOOD  Result Value Ref Range Status   Specimen Description BLOOD LEFT ANTECUBITAL  Final   Special Requests   Final    BOTTLES DRAWN AEROBIC AND ANAEROBIC Blood Culture results may not be optimal due to an inadequate volume of blood received in culture bottles   Culture  Setup Time   Final    GRAM NEGATIVE RODS IN BOTH AEROBIC AND ANAEROBIC BOTTLES CRITICAL RESULT CALLED TO, READ BACK BY AND VERIFIED WITH: K.AMEND,PHARMD AT 0205 ON 10/28/19 BY G.MCADOO Performed at [facility], [place] [address], [place], [place] [address]    Culture KLEBSIELLA PNEUMONIAE (A)  Final   Report Status 10/30/2019 FINAL  Final   Organism ID, Bacteria KLEBSIELLA PNEUMONIAE  Final      Susceptibility   Klebsiella pneumoniae - MIC*    AMPICILLIN RESISTANT Resistant     CEFAZOLIN <=4 SENSITIVE Sensitive     CEFEPIME <=0.12 SENSITIVE Sensitive     CEFTAZIDIME <=1 SENSITIVE Sensitive     CEFTRIAXONE <=0.25 SENSITIVE Sensitive     CIPROFLOXACIN <=0.25 SENSITIVE Sensitive     GENTAMICIN <=1 SENSITIVE Sensitive     IMIPENEM <=0.25 SENSITIVE Sensitive     TRIMETH/SULFA <=20 SENSITIVE Sensitive     AMPICILLIN/SULBACTAM 4 SENSITIVE Sensitive     PIP/TAZO <=4 SENSITIVE Sensitive     * KLEBSIELLA PNEUMONIAE  Blood Culture ID Panel (Reflexed)     Status: Abnormal   Collection Time: 10/27/19 10:36 AM  Result Value Ref Range Status   Enterococcus species NOT DETECTED NOT DETECTED Final   Listeria monocytogenes NOT DETECTED NOT DETECTED Final   Staphylococcus species NOT DETECTED NOT DETECTED Final   Staphylococcus aureus (BCID) NOT DETECTED NOT DETECTED Final   Streptococcus species NOT DETECTED NOT DETECTED Final    Streptococcus agalactiae NOT DETECTED NOT DETECTED Final   Streptococcus pneumoniae NOT DETECTED NOT DETECTED Final   Streptococcus pyogenes NOT DETECTED NOT DETECTED Final   Acinetobacter baumannii NOT DETECTED NOT DETECTED Final   Enterobacteriaceae species DETECTED (A) NOT DETECTED Final    Comment: Enterobacteriaceae represent a large family of gram-negative bacteria, not a single organism. CRITICAL RESULT CALLED TO, READ BACK BY AND VERIFIED WITH: K.AMEND,PHARMD AT 0205 ON 10/28/19 BY G.MCADOO    Enterobacter cloacae complex NOT DETECTED NOT DETECTED Final   Escherichia coli NOT DETECTED NOT DETECTED Final  Klebsiella oxytoca NOT DETECTED NOT DETECTED Final   Klebsiella pneumoniae DETECTED (A) NOT DETECTED Final    Comment: CRITICAL RESULT CALLED TO, READ BACK BY AND VERIFIED WITH: K.AMEND,PHARMD AT 0205 ON 10/28/19 BY G.MCADOO    Proteus species NOT DETECTED NOT DETECTED Final   Serratia marcescens NOT DETECTED NOT DETECTED Final   Carbapenem resistance NOT DETECTED NOT DETECTED Final   Haemophilus influenzae NOT DETECTED NOT DETECTED Final   Neisseria meningitidis NOT DETECTED NOT DETECTED Final   Pseudomonas aeruginosa NOT DETECTED NOT DETECTED Final   Candida albicans NOT DETECTED NOT DETECTED Final   Candida glabrata NOT DETECTED NOT DETECTED Final   Candida krusei NOT DETECTED NOT DETECTED Final   Candida parapsilosis NOT DETECTED NOT DETECTED Final   Candida tropicalis NOT DETECTED NOT DETECTED Final    Comment: Performed at [facility], [place]. [address], [place], [place] [address]  Respiratory Panel by RT PCR (Flu A&B, Covid) - Nasopharyngeal Swab     Status: Abnormal   Collection Time: 10/27/19 11:43 AM   Specimen: Nasopharyngeal Swab  Result Value Ref Range Status   SARS Coronavirus 2 by RT PCR POSITIVE (A) NEGATIVE Final    Comment: RESULT CALLED TO, READ BACK BY AND VERIFIED WITH: Alesia Banda RN 13:25 10/27/19 ([email or web address]) (NOTE) SARS-CoV-2 target nucleic acids are  DETECTED. SARS-CoV-2 RNA is generally detectable in upper respiratory specimens  during the acute phase of infection. Positive results are indicative of the presence of the identified virus, but do not rule out bacterial infection or co-infection with other pathogens not detected by the test. Clinical correlation with patient history and other diagnostic information is necessary to determine patient infection status. The expected result is Negative. Fact Sheet for Patients:  PinkCheek.be Fact Sheet for Healthcare Providers: GravelBags.it This test is not yet approved or cleared by the [place] FDA and  has been authorized for detection and/or diagnosis of SARS-CoV-2 by FDA under an Emergency Use Authorization (EUA).  This EUA will remain in effect (meaning this test can be used)  for the duration of  the COVID-19 declaration under Section 564(b)(1) of the Act, 21 U.S.C. section 360bbb-3(b)(1), unless the authorization is terminated or revoked sooner.    Influenza A by PCR NEGATIVE NEGATIVE Final   Influenza B by PCR NEGATIVE NEGATIVE Final    Comment: (NOTE) The Xpert Xpress SARS-CoV-2/FLU/RSV assay is intended as an aid in  the diagnosis of influenza from Nasopharyngeal swab specimens and  should not be used as a sole basis for treatment. Nasal washings and  aspirates are unacceptable for Xpert Xpress SARS-CoV-2/FLU/RSV  testing. Fact Sheet for Patients: PinkCheek.be Fact Sheet for Healthcare Providers: GravelBags.it This test is not yet approved or cleared by the [place] FDA and  has been authorized for detection and/or diagnosis of SARS-CoV-2 by  FDA under an Emergency Use Authorization (EUA). This EUA will remain  in effect (meaning this test can be used) for the duration of the  Covid-19 declaration under Section 564(b)(1) of the Act, 21  U.S.C.  section 360bbb-3(b)(1), unless the authorization is  terminated or revoked. Performed at [facility], [place] [address], [place], [place] [address]   Urine culture     Status: None   Collection Time: 10/28/19  1:08 PM   Specimen: In/Out Cath Urine  Result Value Ref Range Status   Specimen Description   Final    IN/OUT CATH URINE Performed at [facility] [place], [place]  [id number]    Special Requests   Final    NONE Performed at [facility], [place] [address] [place], [place] [address]    Culture   Final    NO GROWTH Performed at [facility], [place] [address] [place], [place] [address]    Report Status 10/29/2019 FINAL  Final    Radiology Reports NM Pulmonary Perfusion  Result Date: 10/27/2019 CLINICAL DATA:  Hypoxia.  Elevated D-dimer EXAM: NUCLEAR MEDICINE PERFUSION LUNG SCAN TECHNIQUE: Perfusion images were obtained in multiple projections after intravenous injection of radiopharmaceutical. Ventilation scans intentionally deferred if perfusion scan and chest x-ray adequate for interpretation during COVID 19 epidemic. Views: Anterior, posterior, left lateral, right lateral, RPO, LPO, RAO, LAO RADIOPHARMACEUTICALS:  1.54 mCi Tc-28m MAA IV COMPARISON:  Chest radiograph October 27, 2019 FINDINGS: Radiotracer uptake is homogeneous and symmetric bilaterally. No perfusion defects evident. IMPRESSION: No demonstrable perfusion defects. Very low probability of pulmonary embolus. Electronically Signed   By: Lowella Grip III M.D.   On: 10/27/2019 16:28   US Renal  Result Date: 10/28/2019 CLINICAL DATA:  Acute kidney injury, history hypertension, coronary artery disease, CHF, diabetes mellitus, COVID-19 EXAM: RENAL / URINARY TRACT ULTRASOUND COMPLETE COMPARISON:  None FINDINGS: Right Kidney: Renal measurements: 11.4 x 4.6 x 5.5 cm = volume: 152 mL. Significant cortical thinning. Mildly increased cortical echogenicity.  Hypoechoic nodule likely small cyst at upper pole 16 x 16 x 14 mm. Additional tiny hypoechoic nodule at inferior pole likely an additional exophytic cyst 10 x 13 x 9 mm, containing a few scattered internal echoes. No additional mass or hydronephrosis. No shadowing calculi. Left Kidney: Renal measurements: 10.6 x 5.6 x 4.4 cm = volume: 134 mL. Cortical thinning. Increased cortical echogenicity. Exophytic cyst at inferior pole 1.6 x 1.6 x 1.8 cm. Additional exophytic cyst at upper pole, simple character, 2.6 x 3.1 x 2.9 cm. No additional mass or hydronephrosis. No shadowing calculi. Bladder: Appears normal for degree of bladder distention. Other: N/A IMPRESSION: Cortical atrophy and medical renal disease changes of both kidneys. BILATERAL renal cysts. No evidence of hydronephrosis. Electronically Signed   By: Lavonia Dana M.D.   On: 10/28/2019 16:45   Portable chest 1 View  Result Date: 10/29/2019 CLINICAL DATA:  Shortness of breath, pneumonia. EXAM: PORTABLE CHEST 1 VIEW COMPARISON:  October 27, 2019. FINDINGS: Stable cardiomegaly. Atherosclerosis of thoracic aorta is noted. No pneumothorax is noted. Stable bilateral lung opacities are noted concerning for multifocal pneumonia. Bony thorax is unremarkable. IMPRESSION: Aortic atherosclerosis. Stable bilateral lung opacities are noted concerning for multifocal pneumonia. Electronically Signed   By: Marijo Conception M.D.   On: 10/29/2019 08:21   DG Chest Port 1 View  Result Date: 10/27/2019 CLINICAL DATA:  COVID positive.  Hypoxia EXAM: PORTABLE CHEST 1 VIEW COMPARISON:  None FINDINGS: Bibasilar infiltrates compatible with pneumonia. Negative for heart failure or effusion. Atherosclerotic aorta. Chronic appearing rib fractures on the right. Degenerative change in the right shoulder. IMPRESSION: Bibasilar infiltrates consistent with pneumonia Electronically Signed   By: Franchot Gallo M.D.   On: 10/27/2019 11:07   ECHOCARDIOGRAM LIMITED  Result Date:  10/27/2019   ECHOCARDIOGRAM REPORT   Patient Name:   Chloe Ramsey Date of Exam: 10/27/2019 Medical Rec #:  [id number]  Height:       66.0 in Accession #:    [id number] Weight:       176.0 lb Date of Birth:  06-Jul-1930  BSA:  1.89 m Patient Age:    89 years   BP:           95/44 mmHg Patient Gender: F          HR:           90 bpm. Exam Location:  Inpatient Procedure: Limited Echo STAT ECHO Indications:    Congestive heart Failure I50.31  History:        Patient has no prior history of Echocardiogram examinations.                 CHF, CAD, Covid-19 Positive; Risk Factors:Hypertension,                 Dyslipidemia and Diabetes.  Sonographer:    Mikki Santee RDCS (AE) Referring Phys: [place]  1. Left ventricular ejection fraction, by visual estimation, is 60 to 65%. The left ventricle has normal function. There is mildly increased left ventricular hypertrophy.  2. Elevated left atrial pressure.  3. Left ventricular diastolic parameters are consistent with Grade II diastolic dysfunction (pseudonormalization).  4. The left ventricle has no regional wall motion abnormalities.  5. Global right ventricle has normal systolic function.The right ventricular size is normal. No increase in right ventricular wall thickness.  6. Left atrial size was mildly dilated.  7. Right atrial size was normal.  8. Mild mitral annular calcification.  9. The mitral valve is normal in structure. No evidence of mitral valve regurgitation. No evidence of mitral stenosis. 10. The tricuspid valve is normal in structure. 11. The aortic valve is normal in structure. Aortic valve regurgitation is not visualized. Mild aortic valve sclerosis without stenosis. 12. The pulmonic valve was normal in structure. Pulmonic valve regurgitation is not visualized. 13. Normal pulmonary artery systolic pressure. 14. The inferior vena cava is normal in size with greater than 50% respiratory variability, suggesting right atrial  pressure of 3 mmHg. FINDINGS  Left Ventricle: Left ventricular ejection fraction, by visual estimation, is 60 to 65%. The left ventricle has normal function. The left ventricle has no regional wall motion abnormalities. There is mildly increased left ventricular hypertrophy. Concentric left ventricular hypertrophy. Left ventricular diastolic parameters are consistent with Grade II diastolic dysfunction (pseudonormalization). Elevated left atrial pressure. Right Ventricle: The right ventricular size is normal. No increase in right ventricular wall thickness. Global RV systolic function is has normal systolic function. The tricuspid regurgitant velocity is 2.52 m/s, and with an assumed right atrial pressure  of 3 mmHg, the estimated right ventricular systolic pressure is normal at 28.4 mmHg. Left Atrium: Left atrial size was mildly dilated. Right Atrium: Right atrial size was normal in size Pericardium: There is no evidence of pericardial effusion. Mitral Valve: The mitral valve is normal in structure. Mild mitral annular calcification. No evidence of mitral valve regurgitation. No evidence of mitral valve stenosis by observation. Tricuspid Valve: The tricuspid valve is normal in structure. Tricuspid valve regurgitation is trivial. Aortic Valve: The aortic valve is normal in structure. Aortic valve regurgitation is not visualized. Mild aortic valve sclerosis is present, with no evidence of aortic valve stenosis. Pulmonic Valve: The pulmonic valve was normal in structure. Pulmonic valve regurgitation is not visualized. Pulmonic regurgitation is not visualized. Aorta: The aortic root, ascending aorta and aortic arch are all structurally normal, with no evidence of dilitation or obstruction. Venous: The inferior vena cava is normal in size with greater than 50% respiratory variability, suggesting right atrial pressure of 3 mmHg. IAS/Shunts: No atrial level  shunt detected by color flow Doppler. There is no evidence of a  patent foramen ovale. No ventricular septal defect is seen or detected. There is no evidence of an atrial septal defect.  LEFT VENTRICLE PLAX 2D LVIDd:         3.30 cm Diastology LVIDs:         2.40 cm LV e' lateral:   4.79 cm/s LV PW:         1.20 cm LV E/e' lateral: 15.4 LV IVS:        1.20 cm LV e' medial:    5.44 cm/s LV SV:         24 ml   LV E/e' medial:  13.5 LV SV Index:   12.30  LEFT ATRIUM           Index       RIGHT ATRIUM           Index LA diam:      3.10 cm 1.64 cm/m  RA Area:     15.50 cm LA Vol (A2C): 37.1 ml 19.59 ml/m RA Volume:   40.20 ml  21.22 ml/m LA Vol (A4C): 65.1 ml 34.37 ml/m   AORTA Ao Root diam: 3.30 cm MITRAL VALVE                        TRICUSPID VALVE MV Area (PHT): 2.80 cm             TR Peak grad:   25.4 mmHg MV PHT:        78.59 msec           TR Vmax:        252.00 cm/s MV Decel Time: 271 msec MV E velocity: 73.70 cm/s 103 cm/s MV A velocity: 68.60 cm/s 70.3 cm/s MV E/A ratio:  1.07       1.5  Mihai Croitoru MD Electronically signed by Sanda Klein MD Signature Date/Time: 10/27/2019/6:16:16 PM    Final

## 2019-10-30 NOTE — Progress Notes (Signed)
Pharmacy Antibiotic Note  Chloe Ramsey is a 86 y.o. female admitted on 10/27/2019 with ShOB, was dx 12/23 with Covid at SN[facility].  Pharmacy was consulted for Meropenem dosing on admit > Aztreonam > Cipro on 1/15  Plan: D4 abx for Kleb pneumo bacteremia 1/15 change Abx to Cipro 400mg  IV q12  Height: 5\' 6"  (167.6 cm) Weight: 176 lb (79.8 kg) IBW/kg (Calculated) : 59.3  Temp (24hrs), Avg:98.3 F (36.8 C), Min:97.5 F (36.4 C), Max:99.1 F (37.3 C)  Recent Labs  Lab 10/27/19 1036 10/27/19 1037 10/27/19 1310 10/28/19 0730 10/28/19 0740 10/29/19 0244 10/30/19 0119  WBC  --  53.5*  --  26.3*  --  24.6* 16.1*  CREATININE  --  3.05*  --   --  1.83* 1.32* 1.18*  LATICACIDVEN 5.5*  --  4.5*  --   --   --   --     Estimated Creatinine Clearance: 35.8 mL/min (A) (by C-G formula based on SCr of 1.18 mg/dL (H)).    Allergies  Allergen Reactions  . Lopid [Gemfibrozil]   . Penicillins     Antimicrobials this admission: 1/12 Vanc & Levaquin x1 1/12 Meropenem >> 1/13 1/13 Aztreonam >> 1/15 1/15 Cipro >>  Dose adjustments this admission: 1/14 Aztreonam 1gm to 2gm q8  Microbiology results: 1/12 BCx: BCID - Kleb pnemo - Amp resistant 1/13 UCx: ng - final  Thank you for allowing pharmacy to be a part of this patient's care.  Minda Ditto PharmD 10/30/2019 1:34 PM

## 2019-10-30 NOTE — Progress Notes (Signed)
  Speech Language Pathology Treatment: Dysphagia  Patient Details Name: Chloe Ramsey MRN: [id number] DOB: 22-May-1930 Today's Date: 10/30/2019 Time: 0950-1007 SLP Time Calculation (min) (ACUTE ONLY): 17 min  Assessment / Plan / Recommendation Clinical Impression  Pt less interactive than when last seen by SLP. On room air, desaturating intermittently to 80s.  Requesting water and drank 6 oz with assistance for positioning but pt held cup independently.  There was occasional, delayed coughing during session, but unable to discern if a direct consequence of eating/drinking. Consumed several bites of pudding with occasional verbal cues to attend to approaching spoon, then declined, saying she wasn't hungry.  Per CXR 1/14, stable bilateral lung opacities are noted, compatible with 1/12 imaging. Breath sounds are unchanged since admission (diminished).  Unless 02 needs increase and there are changes in overall respiratory function, pt should be safe on current diet.  Continue dysphagia 2 diet, thin liquids for now.  SLP will f/u for toleration/safety and readiness for diet advancement.   HPI HPI: 86 year old female who presented to the ED on 1/12 from [facility] with SOB and hypotension. PMH of CAD, CHF, dementia, HLD, HTN, PUD. Pt diagnosed with COVID-19 on 12/23. Code Sepsis called in ED. Pt admitted with mixed picture of COVID-19 and bacterial pneumonia.       SLP Plan  Continue with current plan of care       Recommendations  Diet recommendations: Dysphagia 2 (fine chop);Thin liquid Liquids provided via: Cup;Straw Medication Administration: Whole meds with puree Supervision: Staff to assist with self feeding Compensations: Slow rate;Minimize environmental distractions                Oral Care Recommendations: Oral care BID Follow up Recommendations: None SLP Visit Diagnosis: Dysphagia, oropharyngeal phase (R13.12) Plan: Continue with current plan of care       GO               Chloe Ramsey L. Chloe Ramsey, [place] CCC/SLP Acute Rehabilitation Services Office number [phone] Pager [phone]   Chloe Ramsey 10/30/2019, 10:09 AM

## 2019-10-31 DIAGNOSIS — E1159 Type 2 diabetes mellitus with other circulatory complications: Secondary | ICD-10-CM

## 2019-10-31 DIAGNOSIS — I10 Essential (primary) hypertension: Secondary | ICD-10-CM

## 2019-10-31 LAB — COMPREHENSIVE METABOLIC PANEL
ALT: 20 U/L (ref 0–44)
AST: 21 U/L (ref 15–41)
Albumin: 2.5 g/dL — ABNORMAL LOW (ref 3.5–5.0)
Alkaline Phosphatase: 80 U/L (ref 38–126)
Anion gap: 10 (ref 5–15)
BUN: 52 mg/dL — ABNORMAL HIGH (ref 8–23)
CO2: 26 mmol/L (ref 22–32)
Calcium: 10 mg/dL (ref 8.9–10.3)
Chloride: 104 mmol/L (ref 98–111)
Creatinine, Ser: 0.91 mg/dL (ref 0.44–1.00)
GFR calc Af Amer: >60 mL/min (ref >60)
GFR calc non Af Amer: 57 mL/min — ABNORMAL LOW (ref >60)
Glucose, Bld: 177 mg/dL — ABNORMAL HIGH (ref 70–99)
Potassium: 4.1 mmol/L (ref 3.5–5.1)
Sodium: 140 mmol/L (ref 135–145)
Total Bilirubin: 1.1 mg/dL (ref 0.3–1.2)
Total Protein: 5.4 g/dL — ABNORMAL LOW (ref 6.5–8.1)

## 2019-10-31 LAB — CBC WITH DIFFERENTIAL/PLATELET
Abs Immature Granulocytes: 0.1 10*3/uL — ABNORMAL HIGH (ref 0.00–0.07)
Basophils Absolute: 0 10*3/uL (ref 0.0–0.1)
Basophils Relative: 0 %
Eosinophils Absolute: 0 10*3/uL (ref 0.0–0.5)
Eosinophils Relative: 0 %
HCT: 42.3 % (ref 36.0–46.0)
Hemoglobin: 13.9 g/dL (ref 12.0–15.0)
Immature Granulocytes: 1 %
Lymphocytes Relative: 7 %
Lymphs Abs: 0.8 10*3/uL (ref 0.7–4.0)
MCH: 30.5 pg (ref 26.0–34.0)
MCHC: 32.9 g/dL (ref 30.0–36.0)
MCV: 92.8 fL (ref 80.0–100.0)
Monocytes Absolute: 0.6 10*3/uL (ref 0.1–1.0)
Monocytes Relative: 5 %
Neutro Abs: 10.9 10*3/uL — ABNORMAL HIGH (ref 1.7–7.7)
Neutrophils Relative %: 87 %
Platelets: 241 10*3/uL (ref 150–400)
RBC: 4.56 MIL/uL (ref 3.87–5.11)
RDW: 13.4 % (ref 11.5–15.5)
WBC: 12.5 10*3/uL — ABNORMAL HIGH (ref 4.0–10.5)
nRBC: 0 % (ref 0.0–0.2)

## 2019-10-31 LAB — D-DIMER, QUANTITATIVE: D-Dimer, Quant: 0.64 ug/mL-FEU — ABNORMAL HIGH (ref 0.00–0.50)

## 2019-10-31 LAB — C-REACTIVE PROTEIN: CRP: 3 mg/dL — ABNORMAL HIGH (ref <1.0)

## 2019-10-31 LAB — GLUCOSE, CAPILLARY
Glucose-Capillary: 150 mg/dL — ABNORMAL HIGH (ref 70–99)
Glucose-Capillary: 152 mg/dL — ABNORMAL HIGH (ref 70–99)

## 2019-10-31 LAB — FERRITIN: Ferritin: 193 ng/mL (ref 11–307)

## 2019-10-31 MED ORDER — INSULIN ASPART 100 UNIT/ML ~~LOC~~ SOLN
0.0000 [IU] | Freq: Three times a day (TID) | SUBCUTANEOUS | Status: DC
  Administered 2019-10-31: 12:00:00 2 [IU] via SUBCUTANEOUS

## 2019-10-31 MED ORDER — CIPROFLOXACIN HCL 500 MG PO TABS: 500.0000 mg | ORAL_TABLET | Freq: Two times a day (BID) | ORAL | 0 refills | Status: AC

## 2019-10-31 MED ORDER — INSULIN ASPART 100 UNIT/ML ~~LOC~~ SOLN: SUBCUTANEOUS | 11 refills | Status: AC

## 2019-10-31 NOTE — Progress Notes (Signed)
Patient is resting in bed, confused, reoriented to situation, made comfortable.  Blood sugars and vitals obtained, they are stable. No acute changes in her condition at this moment, will continue to monitor during the shift.  Bladder scanned to assess for retention, volume <49ml

## 2019-10-31 NOTE — [place] FL2 (Signed)
[place] MEDICAID FL2 LEVEL OF CARE SCREENING TOOL     IDENTIFICATION  Patient Name: Chloe Ramsey Birthdate: 1929-12-08 Sex: female Admission Date (Current Location): 10/27/2019  [facility] and [place] Number:  Herbalist and Address:  [facility], [place] [address], [place], [place])      Provider Number: [id number]  Attending Physician Name and Address:  Jonetta Osgood, MD  Relative Name and Phone Number:  son: Brentney Alhassan  [phone]    Current Level of Care: Hospital Recommended Level of Care: [place] Prior Approval Number:    Date Approved/Denied:   PASRR Number: [id number]  Discharge Plan: SNF    Current Diagnoses: Patient Active Problem List   Diagnosis Date Noted  . Type 2 diabetes mellitus with stage 4 chronic kidney disease ([place]) 10/27/2019  . Hypertension complicating diabetes ([place]) 10/27/2019  . Acute on chronic combined systolic and diastolic CHF (congestive heart failure) ([place]) 10/27/2019  . Pneumonia due to COVID-19 virus 10/27/2019  . HAP (hospital-acquired pneumonia) 10/27/2019    Orientation RESPIRATION BLADDER Height & Weight     Self  Normal Continent Weight: 79.8 kg Height:  5\' 6"  (167.6 cm)  BEHAVIORAL SYMPTOMS/MOOD NEUROLOGICAL BOWEL NUTRITION STATUS      Continent Diet  AMBULATORY STATUS COMMUNICATION OF NEEDS Skin   Extensive Assist Verbally Normal                       Personal Care Assistance Level of Assistance  Total care       Total Care Assistance: Maximum assistance   Functional Limitations Info             SPECIAL CARE FACTORS FREQUENCY  PT (By licensed PT), OT (By licensed OT)     PT Frequency: 5x/week OT Frequency: min 3x/week            Contractures Contractures Info: Not present    Additional Factors Info  Code Status, Allergies Code Status Info: DNR Allergies Info: Lopid and Penicillins           Current  Medications (10/31/2019):  This is the current hospital active medication list Current Facility-Administered Medications  Medication Dose Route Frequency Provider Last Rate Last Admin  . 0.45 % sodium chloride infusion   Intravenous Continuous Norins, Heinz Knuckles, MD   Stopped at 10/27/19 2211  . acetaminophen (TYLENOL) tablet 650 mg  650 mg Oral Q6H PRN Neena Rhymes, MD   650 mg at 10/28/19 1328  . apixaban (ELIQUIS) tablet 2.5 mg  2.5 mg Oral BID Jonetta Osgood, MD   2.5 mg at 10/31/19 0831  . ciprofloxacin (CIPRO) IVPB 400 mg  400 mg Intravenous Q12H Jonetta Osgood, MD   Stopped at 10/31/19 0240  . dexamethasone (DECADRON) tablet 6 mg  6 mg Oral Q24H Norins, Heinz Knuckles, MD   6 mg at 10/30/19 1457  . docusate sodium (COLACE) capsule 100 mg  100 mg Oral BID Neena Rhymes, MD   100 mg at 10/30/19 2123  . feeding supplement (ENSURE ENLIVE) (ENSURE ENLIVE) liquid 237 mL  237 mL Oral TID BM Jonetta Osgood, MD   237 mL at 10/31/19 0837  . fesoterodine (TOVIAZ) tablet 4 mg  4 mg Oral Daily Norins, Heinz Knuckles, MD   4 mg at 10/31/19 0831  . haloperidol (HALDOL) tablet 1 mg  1 mg Oral TID Norins, Heinz Knuckles, MD   1 mg at  10/31/19 0831  . insulin aspart (novoLOG) injection 0-20 Units  0-20 Units Subcutaneous Q4H Norins, Heinz Knuckles, MD   3 Units at 10/31/19 0428  . insulin detemir (LEVEMIR) injection 6 Units  0.075 Units/kg Subcutaneous BID Norins, Heinz Knuckles, MD   6 Units at 10/31/19 [phone]  . mirtazapine (REMERON) tablet 7.5 mg  7.5 mg Oral QHS Norins, Heinz Knuckles, MD   7.5 mg at 10/30/19 2122     Discharge Medications: Please see discharge summary for a list of discharge medications.  Relevant Imaging Results:  Relevant Lab Results:   Additional Information SSN: [id number]  Ninfa Meeker, RN

## 2019-10-31 NOTE — Progress Notes (Signed)
Attempted to call report to [facility] X4 times. Unable to reach nurse. Phone cuts off without additional response.

## 2019-10-31 NOTE — TOC Transition Note (Signed)
Transition of Care [facility]) - CM/SW Discharge Note   Patient Details  Name: Jodie Sabel MRN: [id number] Date of Birth: 04/15/1930  Transition of Care [facility]) CM/SW Contact:  Ninfa Meeker, RN Phone Number: [phone] (working remotely) 10/31/2019, 11:35 AM   Clinical Narrative:    Patient will DC to [facility] Anticipated DC date: 10/31/19 Family notified: Son: Casandra Doffing Transport by: Corey Harold: 12:30pm  Patient is medically ready to return to [facility]. Patient's son, Camera operator and Bedside R are aware of discharge plan. Case Manager confirmed with Freda Munro at [facility] that bed is available. Patient will go to room [place], bedside RN to call report to [phone]. Case manager has printed Medical necessity form and demographic to the nurse's unit. FL2 and discharge summary have been sent to [facility] via [employer]. Ambulance transport has been requested.     Barriers to Discharge: No Barriers Identified   Patient Goals and CMS Choice        Discharge Placement                       Discharge Plan and Services                DME Arranged: N/A DME Agency: NA                  Social Determinants of Health (SDOH) Interventions     Readmission Risk Interventions No flowsheet data found.

## 2019-10-31 NOTE — Progress Notes (Signed)
   10/31/19 1129  Family/Significant Other Communication  Family/Significant Other Update Called;Updated (Son Richard)

## 2019-10-31 NOTE — Discharge Instructions (Signed)
COVID-19 COVID-19 is a respiratory infection that is caused by a virus called severe acute respiratory syndrome coronavirus 2 (SARS-CoV-2). The disease is also known as coronavirus disease or novel coronavirus. In some people, the virus may not cause any symptoms. In others, it may cause a serious infection. The infection can get worse quickly and can lead to complications, such as:  Pneumonia, or infection of the lungs.  Acute respiratory distress syndrome or ARDS. This is a condition in which fluid build-up in the lungs prevents the lungs from filling with air and passing oxygen into the blood.  Acute respiratory failure. This is a condition in which there is not enough oxygen passing from the lungs to the body or when carbon dioxide is not passing from the lungs out of the body.  Sepsis or septic shock. This is a serious bodily reaction to an infection.  Blood clotting problems.  Secondary infections due to bacteria or fungus.  Organ failure. This is when your body's organs stop working. The virus that causes COVID-19 is contagious. This means that it can spread from person to person through droplets from coughs and sneezes (respiratory secretions). What are the causes? This illness is caused by a virus. You may catch the virus by:  Breathing in droplets from an infected person. Droplets can be spread by a person breathing, speaking, singing, coughing, or sneezing.  Touching something, like a table or a doorknob, that was exposed to the virus (contaminated) and then touching your mouth, nose, or eyes. What increases the risk? Risk for infection You are more likely to be infected with this virus if you:  Are within 6 feet (2 meters) of a person with COVID-19.  Provide care for or live with a person who is infected with COVID-19.  Spend time in crowded indoor spaces or live in shared housing. Risk for serious illness You are more likely to become seriously ill from the virus if you:   Are 50 years of age or older. The higher your age, the more you are at risk for serious illness.  Live in a nursing home or long-term care facility.  Have cancer.  Have a long-term (chronic) disease such as: ? Chronic lung disease, including chronic obstructive pulmonary disease or asthma. ? A long-term disease that lowers your body's ability to fight infection (immunocompromised). ? Heart disease, including heart failure, a condition in which the arteries that lead to the heart become narrow or blocked (coronary artery disease), a disease which makes the heart muscle thick, weak, or stiff (cardiomyopathy). ? Diabetes. ? Chronic kidney disease. ? Sickle cell disease, a condition in which red blood cells have an abnormal "sickle" shape. ? Liver disease.  Are obese. What are the signs or symptoms? Symptoms of this condition can range from mild to severe. Symptoms may appear any time from 2 to 14 days after being exposed to the virus. They include:  A fever or chills.  A cough.  Difficulty breathing.  Headaches, body aches, or muscle aches.  Runny or stuffy (congested) nose.  A sore throat.  New loss of taste or smell. Some people may also have stomach problems, such as nausea, vomiting, or diarrhea. Other people may not have any symptoms of COVID-19. How is this diagnosed? This condition may be diagnosed based on:  Your signs and symptoms, especially if: ? You live in an area with a COVID-19 outbreak. ? You recently traveled to or from an area where the virus is common. ? You   provide care for or live with a person who was diagnosed with COVID-19. ? You were exposed to a person who was diagnosed with COVID-19.  A physical exam.  Lab tests, which may include: ? Taking a sample of fluid from the back of your nose and throat (nasopharyngeal fluid), your nose, or your throat using a swab. ? A sample of mucus from your lungs (sputum). ? Blood tests.  Imaging tests, which  may include, X-rays, CT scan, or ultrasound. How is this treated? At present, there is no medicine to treat COVID-19. Medicines that treat other diseases are being used on a trial basis to see if they are effective against COVID-19. Your health care provider will talk with you about ways to treat your symptoms. For most people, the infection is mild and can be managed at home with rest, fluids, and over-the-counter medicines. Treatment for a serious infection usually takes places in a hospital intensive care unit (ICU). It may include one or more of the following treatments. These treatments are given until your symptoms improve.  Receiving fluids and medicines through an IV.  Supplemental oxygen. Extra oxygen is given through a tube in the nose, a face mask, or a hood.  Positioning you to lie on your stomach (prone position). This makes it easier for oxygen to get into the lungs.  Continuous positive airway pressure (CPAP) or bi-level positive airway pressure (BPAP) machine. This treatment uses mild air pressure to keep the airways open. A tube that is connected to a motor delivers oxygen to the body.  Ventilator. This treatment moves air into and out of the lungs by using a tube that is placed in your windpipe.  Tracheostomy. This is a procedure to create a hole in the neck so that a breathing tube can be inserted.  Extracorporeal membrane oxygenation (ECMO). This procedure gives the lungs a chance to recover by taking over the functions of the heart and lungs. It supplies oxygen to the body and removes carbon dioxide. Follow these instructions at home: Lifestyle  If you are sick, stay home except to get medical care. Your health care provider will tell you how long to stay home. Call your health care provider before you go for medical care.  Rest at home as told by your health care provider.  Do not use any products that contain nicotine or tobacco, such as cigarettes, e-cigarettes, and  chewing tobacco. If you need help quitting, ask your health care provider.  Return to your normal activities as told by your health care provider. Ask your health care provider what activities are safe for you. General instructions  Take over-the-counter and prescription medicines only as told by your health care provider.  Drink enough fluid to keep your urine pale yellow.  Keep all follow-up visits as told by your health care provider. This is important. How is this prevented?  There is no vaccine to help prevent COVID-19 infection. However, there are steps you can take to protect yourself and others from this virus. To protect yourself:   Do not travel to areas where COVID-19 is a risk. The areas where COVID-19 is reported change often. To identify high-risk areas and travel restrictions, check the CDC travel website: wwwnc.cdc.gov/travel/notices  If you live in, or must travel to, an area where COVID-19 is a risk, take precautions to avoid infection. ? Stay away from people who are sick. ? Wash your hands often with soap and water for 20 seconds. If soap and water   are not available, use an alcohol-based hand sanitizer. ? Avoid touching your mouth, face, eyes, or nose. ? Avoid going out in public, follow guidance from your state and local health authorities. ? If you must go out in public, wear a cloth face covering or face mask. Make sure your mask covers your nose and mouth. ? Avoid crowded indoor spaces. Stay at least 6 feet (2 meters) away from others. ? Disinfect objects and surfaces that are frequently touched every day. This may include:  Counters and tables.  Doorknobs and light switches.  Sinks and faucets.  Electronics, such as phones, remote controls, keyboards, computers, and tablets. To protect others: If you have symptoms of COVID-19, take steps to prevent the virus from spreading to others.  If you think you have a COVID-19 infection, contact your health care  provider right away. Tell your health care team that you think you may have a COVID-19 infection.  Stay home. Leave your house only to seek medical care. Do not use public transport.  Do not travel while you are sick.  Wash your hands often with soap and water for 20 seconds. If soap and water are not available, use alcohol-based hand sanitizer.  Stay away from other members of your household. Let healthy household members care for children and pets, if possible. If you have to care for children or pets, wash your hands often and wear a mask. If possible, stay in your own room, separate from others. Use a different bathroom.  Make sure that all people in your household wash their hands well and often.  Cough or sneeze into a tissue or your sleeve or elbow. Do not cough or sneeze into your hand or into the air.  Wear a cloth face covering or face mask. Make sure your mask covers your nose and mouth. Where to find more information  Centers for Disease Control and Prevention: www.cdc.gov/coronavirus/2019-ncov/index.html  World Health Organization: www.who.int/health-topics/coronavirus Contact a health care provider if:  You live in or have traveled to an area where COVID-19 is a risk and you have symptoms of the infection.  You have had contact with someone who has COVID-19 and you have symptoms of the infection. Get help right away if:  You have trouble breathing.  You have pain or pressure in your chest.  You have confusion.  You have bluish lips and fingernails.  You have difficulty waking from sleep.  You have symptoms that get worse. These symptoms may represent a serious problem that is an emergency. Do not wait to see if the symptoms will go away. Get medical help right away. Call your local emergency services (911 in the [place] Do not drive yourself to the hospital. Let the emergency medical personnel know if you think you have COVID-19. Summary  COVID-19 is a  respiratory infection that is caused by a virus. It is also known as coronavirus disease or novel coronavirus. It can cause serious infections, such as pneumonia, acute respiratory distress syndrome, acute respiratory failure, or sepsis.  The virus that causes COVID-19 is contagious. This means that it can spread from person to person through droplets from breathing, speaking, singing, coughing, or sneezing.  You are more likely to develop a serious illness if you are 50 years of age or older, have a weak immune system, live in a nursing home, or have chronic disease.  There is no medicine to treat COVID-19. Your health care provider will talk with you about ways to treat your symptoms.    Take steps to protect yourself and others from infection. Wash your hands often and disinfect objects and surfaces that are frequently touched every day. Stay away from people who are sick and wear a mask if you are sick. This information is not intended to replace advice given to you by your health care provider. Make sure you discuss any questions you have with your health care provider. Document Revised: 07/31/2019 Document Reviewed: 11/06/2018 Elsevier Patient Education  2020 [employer]  

## 2019-10-31 NOTE — Progress Notes (Signed)
   10/31/19 1236  Patient Belongings/Medications Returned  Patient belongings from bedside/safe/pharmacy returned  Yes  Valuables returned to? [facility]  Specify valuables returned bottom dentures, necklack, watch, ring

## 2019-10-31 NOTE — Discharge Summary (Signed)
PATIENT DETAILS Name: Chloe Ramsey Age: 86 y.o. Sex: female Date of Birth: 1930/06/18 MRN: [id number]. Admitting Physician: Neena Rhymes, MD [id number], Anthony Sar, MD  Admit Date: 10/27/2019 Discharge date: 10/31/2019  Recommendations for Outpatient Follow-up:  1. Follow up with PCP in 1-2 weeks 2. Please obtain CMP/CBC in one week 3. Repeat Chest Xray in 4-6 week 4. Please repeat blood cultures once patient completes a course of ciprofloxacin-to ensure clearance of bacteremia.  Admitted From:  SNF  Disposition: SNF   Home Health: No  Equipment/Devices: None  Discharge Condition: Stable  CODE STATUS: FULL CODE  Diet recommendation:  Diet Order            Diet - low sodium heart healthy        Diet Carb Modified        DIET DYS 2 Room service appropriate? No; Fluid consistency: Thin  Diet effective now               Brief Summary: Patient is a 86 y.o. female with PMHx of dementia, chronic diastolic heart failure, CAD, HLD, HTN who is a resident of [facility]-apparently was diagnosed with Covid on 10/07/2019 presented to the ED on 1/12 with hypoxemia, hypotension-further revealed sepsis along with acute hypoxic respiratory failure secondary to Covid 19 with concurrent bacterial pneumonia and Klebsiella bacteremia with acute kidney injury.  See below for further details  Brief Hospital Course:  Acute Hypoxic Resp Failure due to Covid 19 Viral pneumonia and concurrent bacterial pneumonia: Significantly improved-rapidly titrated down to room air.  Treated with steroids/remdesivir-she will complete both of these agents on 1/16.  Does not need any further steroids as she is on room air.  Was on broad-spectrum antibiotics (see below)-but has now been narrowed down to ciprofloxacin.   COVID-19 Labs:  Recent Labs    10/29/19 0244 10/30/19 0119 10/31/19 0100  DDIMER 0.96* 0.53* 0.64*  FERRITIN 417* 234 193  CRP 12.9* 6.3* 3.0*    Lab Results    Component Value Date   SARSCOV2NAA POSITIVE (A) 10/27/2019     COVID-19 Medications: Steroids: 1/12>> 1/16 Remdesivir: 1/12>> 1/16  Sepsis secondary to Klebsiella bacteremia/pneumonia: Sepsis physiology present on admission-sepsis physiology has resolved-blood pressure has now normalized-AKI significantly better.  See below regarding IV antibiotics-but has been narrowed down to ciprofloxacin (after discussion with pharmacy-has numerous allergies).  She will continue ciprofloxacin for 5 additional days to complete a 10-day course.  Attending MD at SNF to consider repeating blood cultures once she completes a course of antibiotics to document resolution of bacteremia.  Suspect foci of bacteremia to be from the lungs-urine culture negative-abdominal exam is completely benign therefore doubt any GI/biliary source.  Antibiotics: Cipro 1/15>> Azactam 1/13>>1/14 Meropenem 1/12>>1/13 Levofloxacin 1/12 x 1 Vancomycin 1/12 x 1  AKI: Suspect hemodynamically mediated-no prior creatinine levels in chart-not sure if patient has underlying CKD at this point.  Renal ultrasound negative for hydronephrosis.  UA negative for proteinuria.  Thankfully creatinine continues to improve.  Follow.  Chronic diastolic heart failure: Euvolemic-follow  HTN: Hypotensive on presentation-BP but was still soft-hence started on midodrine but now BP seems to be stable-stop midodrine-suspect could resume Coreg/amlodipine on discharge.  DM-2: CBG stable with SSI-monitor closely in the outpatient setting-at SNF.  Dementia: Remains pleasantly confused  Other issues: Not sure why this patient is on Eliquis as an outpatient-but I have resumed it.  Suspect may have A. fib history.  Tried calling his primary care practitioner's office on 1/13-I could  not get through to anyone in spite of being on hold for more than a few minutes-defer further to attending MD at SNF.  Nutrition Problem: Nutrition Problem: Increased nutrient  needs Etiology: catabolic illness, acute [id number], PNA) Signs/Symptoms: estimated needs Interventions: Ensure Enlive (each supplement provides 350kcal and 20 grams of protein)   Procedures/Studies: None  Discharge Diagnoses:  Active Problems:   Type 2 diabetes mellitus with stage 4 chronic kidney disease (HCC)   Hypertension complicating diabetes ([place])   Acute on chronic combined systolic and diastolic CHF (congestive heart failure) ([place])   Pneumonia due to COVID-19 virus   HAP (hospital-acquired pneumonia)   Discharge Instructions: Check CBGs before meals and at bedtime.      Person Under Monitoring Name: Chloe Ramsey  Location: [place] [address]   Infection Prevention Recommendations for Individuals Confirmed to have, or Being Evaluated for, 2019 Novel Coronavirus (COVID-19) Infection Who Receive Care at Home  Individuals who are confirmed to have, or are being evaluated for, COVID-19 should follow the prevention steps below until a healthcare provider or local or state health department says they can return to normal activities.  Stay home except to get medical care You should restrict activities outside your home, except for getting medical care. Do not go to work, school, or public areas, and do not use public transportation or taxis.  Call ahead before visiting your doctor Before your medical appointment, call the healthcare provider and tell them that you have, or are being evaluated for, COVID-19 infection. This will help the healthcare provider's office take steps to keep other people from getting infected. Ask your healthcare provider to call the local or state health department.  Monitor your symptoms Seek prompt medical attention if your illness is worsening (e.g., difficulty breathing). Before going to your medical appointment, call the healthcare provider and tell them that you have, or are being evaluated for, COVID-19  infection. Ask your healthcare provider to call the local or state health department.  Wear a facemask You should wear a facemask that covers your nose and mouth when you are in the same room with other people and when you visit a healthcare provider. People who live with or visit you should also wear a facemask while they are in the same room with you.  Separate yourself from other people in your home As much as possible, you should stay in a different room from other people in your home. Also, you should use a separate bathroom, if available.  Avoid sharing household items You should not share dishes, drinking glasses, cups, eating utensils, towels, bedding, or other items with other people in your home. After using these items, you should wash them thoroughly with soap and water.  Cover your coughs and sneezes Cover your mouth and nose with a tissue when you cough or sneeze, or you can cough or sneeze into your sleeve. Throw used tissues in a lined trash can, and immediately wash your hands with soap and water for at least 20 seconds or use an alcohol-based hand rub.  Wash your [facility] your hands often and thoroughly with soap and water for at least 20 seconds. You can use an alcohol-based hand sanitizer if soap and water are not available and if your hands are not visibly dirty. Avoid touching your eyes, nose, and mouth with unwashed hands.   Prevention Steps for Caregivers and Household Members of Individuals Confirmed to have, or Being Evaluated for, COVID-19 Infection Being Cared  for in the Home  If you live with, or provide care at home for, a person confirmed to have, or being evaluated for, COVID-19 infection please follow these guidelines to prevent infection:  Follow healthcare provider's instructions Make sure that you understand and can help the patient follow any healthcare provider instructions for all care.  Provide for the patient's basic needs You should  help the patient with basic needs in the home and provide support for getting groceries, prescriptions, and other personal needs.  Monitor the patient's symptoms If they are getting sicker, call his or her medical provider and tell them that the patient has, or is being evaluated for, COVID-19 infection. This will help the healthcare provider's office take steps to keep other people from getting infected. Ask the healthcare provider to call the local or state health department.  Limit the number of people who have contact with the patient  If possible, have only one caregiver for the patient.  Other household members should stay in another home or place of residence. If this is not possible, they should stay  in another room, or be separated from the patient as much as possible. Use a separate bathroom, if available.  Restrict visitors who do not have an essential need to be in the home.  Keep older adults, very young children, and other sick people away from the patient Keep older adults, very young children, and those who have compromised immune systems or chronic health conditions away from the patient. This includes people with chronic heart, lung, or kidney conditions, diabetes, and cancer.  Ensure good ventilation Make sure that shared spaces in the home have good air flow, such as from an air conditioner or an opened window, weather permitting.  Wash your hands often  Wash your hands often and thoroughly with soap and water for at least 20 seconds. You can use an alcohol based hand sanitizer if soap and water are not available and if your hands are not visibly dirty.  Avoid touching your eyes, nose, and mouth with unwashed hands.  Use disposable paper towels to dry your hands. If not available, use dedicated cloth towels and replace them when they become wet.  Wear a facemask and gloves  Wear a disposable facemask at all times in the room and gloves when you touch or have  contact with the patient's blood, body fluids, and/or secretions or excretions, such as sweat, saliva, sputum, nasal mucus, vomit, urine, or feces.  Ensure the mask fits over your nose and mouth tightly, and do not touch it during use.  Throw out disposable facemasks and gloves after using them. Do not reuse.  Wash your hands immediately after removing your facemask and gloves.  If your personal clothing becomes contaminated, carefully remove clothing and launder. Wash your hands after handling contaminated clothing.  Place all used disposable facemasks, gloves, and other waste in a lined container before disposing them with other household waste.  Remove gloves and wash your hands immediately after handling these items.  Do not share dishes, glasses, or other household items with the patient  Avoid sharing household items. You should not share dishes, drinking glasses, cups, eating utensils, towels, bedding, or other items with a patient who is confirmed to have, or being evaluated for, COVID-19 infection.  After the person uses these items, you should wash them thoroughly with soap and water.  Wash laundry thoroughly  Immediately remove and wash clothes or bedding that have blood, body fluids, and/or secretions  or excretions, such as sweat, saliva, sputum, nasal mucus, vomit, urine, or feces, on them.  Wear gloves when handling laundry from the patient.  Read and follow directions on labels of laundry or clothing items and detergent. In general, wash and dry with the warmest temperatures recommended on the label.  Clean all areas the individual has used often  Clean all touchable surfaces, such as counters, tabletops, doorknobs, bathroom fixtures, toilets, phones, keyboards, tablets, and bedside tables, every day. Also, clean any surfaces that may have blood, body fluids, and/or secretions or excretions on them.  Wear gloves when cleaning surfaces the patient has come in contact  with.  Use a diluted bleach solution (e.g., dilute bleach with 1 part bleach and 10 parts water) or a household disinfectant with a label that says EPA-registered for coronaviruses. To make a bleach solution at home, add 1 tablespoon of bleach to 1 quart (4 cups) of water. For a larger supply, add  cup of bleach to 1 gallon (16 cups) of water.  Read labels of cleaning products and follow recommendations provided on product labels. Labels contain instructions for safe and effective use of the cleaning product including precautions you should take when applying the product, such as wearing gloves or eye protection and making sure you have good ventilation during use of the product.  Remove gloves and wash hands immediately after cleaning.  Monitor yourself for signs and symptoms of illness Caregivers and household members are considered close contacts, should monitor their health, and will be asked to limit movement outside of the home to the extent possible. Follow the monitoring steps for close contacts listed on the symptom monitoring form.   ? If you have additional questions, contact your local health department or call the epidemiologist on call at [phone] (available 24/7). ? This guidance is subject to change. For the most up-to-date guidance from CDC, please refer to their website: YouBlogs.pl    Activity:  As tolerated with Full fall precautions use walker/cane & assistance as needed   Discharge Instructions    Call MD for:  difficulty breathing, headache or visual disturbances   Complete by: As directed    Call MD for:  extreme fatigue   Complete by: As directed    Call MD for:  persistant nausea and vomiting   Complete by: As directed    Diet - low sodium heart healthy   Complete by: As directed    Diet Carb Modified   Complete by: As directed    Discharge instructions   Complete by: As directed     Follow with Primary MD  Hilbert Corrigan, MD in 1-2 weeks  Please get a complete blood count and chemistry panel checked by your Primary MD at your next visit, and again as instructed by your Primary MD.  Get Medicines reviewed and adjusted: Please take all your medications with you for your next visit with your Primary MD  Laboratory/radiological data: Please request your Primary MD to go over all hospital tests and procedure/radiological results at the follow up, please ask your Primary MD to get all Hospital records sent to his/her office.  In some cases, they will be blood work, cultures and biopsy results pending at the time of your discharge. Please request that your primary care M.D. follows up on these results.  Also Note the following: If you experience worsening of your admission symptoms, develop shortness of breath, life threatening emergency, suicidal or homicidal thoughts you must seek medical attention immediately  by calling 911 or calling your MD immediately  if symptoms less severe.  You must read complete instructions/literature along with all the possible adverse reactions/side effects for all the Medicines you take and that have been prescribed to you. Take any new Medicines after you have completely understood and accpet all the possible adverse reactions/side effects.   Do not drive when taking Pain medications or sleeping medications (Benzodaizepines)  Do not take more than prescribed Pain, Sleep and Anxiety Medications. It is not advisable to combine anxiety,sleep and pain medications without talking with your primary care practitioner  Special Instructions: If you have smoked or chewed Tobacco  in the last 2 yrs please stop smoking, stop any regular Alcohol  and or any Recreational drug use.  Wear Seat belts while driving.  Please note: You were cared for by a hospitalist during your hospital stay. Once you are discharged, your primary care physician will  handle any further medical issues. Please note that NO REFILLS for any discharge medications will be authorized once you are discharged, as it is imperative that you return to your primary care physician (or establish a relationship with a primary care physician if you do not have one) for your post hospital discharge needs so that they can reassess your need for medications and monitor your lab values.   Increase activity slowly   Complete by: As directed      Allergies as of 10/31/2019      Reactions   Lopid [gemfibrozil]    Penicillins       Medication List    STOP taking these medications   aspirin 81 MG chewable tablet   hydrochlorothiazide 12.5 MG capsule Commonly known as: MICROZIDE   valsartan 320 MG tablet Commonly known as: DIOVAN     TAKE these medications   acetaminophen 500 MG tablet Commonly known as: TYLENOL Take 1,000 mg by mouth daily.   amLODipine 2.5 MG tablet Commonly known as: NORVASC Take 2.5 mg by mouth daily.   atorvastatin 40 MG tablet Commonly known as: LIPITOR Take 40 mg by mouth at bedtime.   calcium citrate 950 (200 Ca) MG tablet Commonly known as: CALCITRATE - dosed in mg elemental calcium Take 200 mg of elemental calcium by mouth daily.   carvedilol 12.5 MG tablet Commonly known as: COREG Take 12.5 mg by mouth 2 (two) times daily with a meal.   ciprofloxacin 500 MG tablet Commonly known as: Cipro Take 1 tablet (500 mg total) by mouth 2 (two) times daily for 5 days.   donepezil 10 MG tablet Commonly known as: ARICEPT Take 10 mg by mouth at bedtime.   Eliquis 2.5 MG Tabs tablet Generic drug: apixaban Take 2.5 mg by mouth 2 (two) times daily. For 21 days, started 10-10-19   furosemide 20 MG tablet Commonly known as: LASIX Take 20 mg by mouth daily.   haloperidol 1 MG tablet Commonly known as: HALDOL Take 1 mg by mouth 3 (three) times daily.   insulin aspart 100 UNIT/ML injection Commonly known as: novoLOG 0-9 Units,  Subcutaneous, 3 times daily with meals CBG < 70: Implement Hypoglycemia Standing Orders  CBG 70 - 120: 0 units CBG 121 - 150: 1 unit CBG 151 - 200: 2 units CBG 201 - 250: 3 units CBG 251 - 300: 5 units CBG 301 - 350: 7 units CBG 351 - 400: 9 units CBG > 400: call MD   mirtazapine 7.5 MG tablet Commonly known as: REMERON Take 7.5 mg by mouth  at bedtime.   tolterodine 4 MG 24 hr capsule Commonly known as: DETROL LA Take 4 mg by mouth daily.   Vitamin D 50 MCG (2000 UT) Caps Take 2,000 Units by mouth daily.      Follow-up Information    Hilbert Corrigan, MD. Schedule an appointment as soon as possible for a visit in 1 week(s).   Specialty: Internal Medicine Contact information: [address] [place] [address] [phone]          Allergies  Allergen Reactions  . Lopid [Gemfibrozil]   . Penicillins     Consultations:   None   Other Procedures/Studies: NM Pulmonary Perfusion  Result Date: 10/27/2019 CLINICAL DATA:  Hypoxia.  Elevated D-dimer EXAM: NUCLEAR MEDICINE PERFUSION LUNG SCAN TECHNIQUE: Perfusion images were obtained in multiple projections after intravenous injection of radiopharmaceutical. Ventilation scans intentionally deferred if perfusion scan and chest x-ray adequate for interpretation during COVID 19 epidemic. Views: Anterior, posterior, left lateral, right lateral, RPO, LPO, RAO, LAO RADIOPHARMACEUTICALS:  1.54 mCi Tc-35m MAA IV COMPARISON:  Chest radiograph October 27, 2019 FINDINGS: Radiotracer uptake is homogeneous and symmetric bilaterally. No perfusion defects evident. IMPRESSION: No demonstrable perfusion defects. Very low probability of pulmonary embolus. Electronically Signed   By: Lowella Grip III M.D.   On: 10/27/2019 16:28   US Renal  Result Date: 10/28/2019 CLINICAL DATA:  Acute kidney injury, history hypertension, coronary artery disease, CHF, diabetes mellitus, COVID-19 EXAM: RENAL / URINARY TRACT ULTRASOUND COMPLETE  COMPARISON:  None FINDINGS: Right Kidney: Renal measurements: 11.4 x 4.6 x 5.5 cm = volume: 152 mL. Significant cortical thinning. Mildly increased cortical echogenicity. Hypoechoic nodule likely small cyst at upper pole 16 x 16 x 14 mm. Additional tiny hypoechoic nodule at inferior pole likely an additional exophytic cyst 10 x 13 x 9 mm, containing a few scattered internal echoes. No additional mass or hydronephrosis. No shadowing calculi. Left Kidney: Renal measurements: 10.6 x 5.6 x 4.4 cm = volume: 134 mL. Cortical thinning. Increased cortical echogenicity. Exophytic cyst at inferior pole 1.6 x 1.6 x 1.8 cm. Additional exophytic cyst at upper pole, simple character, 2.6 x 3.1 x 2.9 cm. No additional mass or hydronephrosis. No shadowing calculi. Bladder: Appears normal for degree of bladder distention. Other: N/A IMPRESSION: Cortical atrophy and medical renal disease changes of both kidneys. BILATERAL renal cysts. No evidence of hydronephrosis. Electronically Signed   By: Lavonia Dana M.D.   On: 10/28/2019 16:45   Portable chest 1 View  Result Date: 10/29/2019 CLINICAL DATA:  Shortness of breath, pneumonia. EXAM: PORTABLE CHEST 1 VIEW COMPARISON:  October 27, 2019. FINDINGS: Stable cardiomegaly. Atherosclerosis of thoracic aorta is noted. No pneumothorax is noted. Stable bilateral lung opacities are noted concerning for multifocal pneumonia. Bony thorax is unremarkable. IMPRESSION: Aortic atherosclerosis. Stable bilateral lung opacities are noted concerning for multifocal pneumonia. Electronically Signed   By: Marijo Conception M.D.   On: 10/29/2019 08:21   DG Chest Port 1 View  Result Date: 10/27/2019 CLINICAL DATA:  COVID positive.  Hypoxia EXAM: PORTABLE CHEST 1 VIEW COMPARISON:  None FINDINGS: Bibasilar infiltrates compatible with pneumonia. Negative for heart failure or effusion. Atherosclerotic aorta. Chronic appearing rib fractures on the right. Degenerative change in the right shoulder. IMPRESSION:  Bibasilar infiltrates consistent with pneumonia Electronically Signed   By: Franchot Gallo M.D.   On: 10/27/2019 11:07   ECHOCARDIOGRAM LIMITED  Result Date: 10/27/2019   ECHOCARDIOGRAM REPORT   Patient Name:   Chloe Ramsey Date of Exam: 10/27/2019  Medical Rec #:  [id number]  Height:       66.0 in Accession #:    [id number] Weight:       176.0 lb Date of Birth:  Feb 25, 1930  BSA:          1.89 m Patient Age:    47 years   BP:           95/44 mmHg Patient Gender: F          HR:           90 bpm. Exam Location:  Inpatient Procedure: Limited Echo STAT ECHO Indications:    Congestive heart Failure I50.31  History:        Patient has no prior history of Echocardiogram examinations.                 CHF, CAD, Covid-19 Positive; Risk Factors:Hypertension,                 Dyslipidemia and Diabetes.  Sonographer:    Mikki Santee RDCS (AE) Referring Phys: [place]  1. Left ventricular ejection fraction, by visual estimation, is 60 to 65%. The left ventricle has normal function. There is mildly increased left ventricular hypertrophy.  2. Elevated left atrial pressure.  3. Left ventricular diastolic parameters are consistent with Grade II diastolic dysfunction (pseudonormalization).  4. The left ventricle has no regional wall motion abnormalities.  5. Global right ventricle has normal systolic function.The right ventricular size is normal. No increase in right ventricular wall thickness.  6. Left atrial size was mildly dilated.  7. Right atrial size was normal.  8. Mild mitral annular calcification.  9. The mitral valve is normal in structure. No evidence of mitral valve regurgitation. No evidence of mitral stenosis. 10. The tricuspid valve is normal in structure. 11. The aortic valve is normal in structure. Aortic valve regurgitation is not visualized. Mild aortic valve sclerosis without stenosis. 12. The pulmonic valve was normal in structure. Pulmonic valve regurgitation is not visualized. 13.  Normal pulmonary artery systolic pressure. 14. The inferior vena cava is normal in size with greater than 50% respiratory variability, suggesting right atrial pressure of 3 mmHg. FINDINGS  Left Ventricle: Left ventricular ejection fraction, by visual estimation, is 60 to 65%. The left ventricle has normal function. The left ventricle has no regional wall motion abnormalities. There is mildly increased left ventricular hypertrophy. Concentric left ventricular hypertrophy. Left ventricular diastolic parameters are consistent with Grade II diastolic dysfunction (pseudonormalization). Elevated left atrial pressure. Right Ventricle: The right ventricular size is normal. No increase in right ventricular wall thickness. Global RV systolic function is has normal systolic function. The tricuspid regurgitant velocity is 2.52 m/s, and with an assumed right atrial pressure  of 3 mmHg, the estimated right ventricular systolic pressure is normal at 28.4 mmHg. Left Atrium: Left atrial size was mildly dilated. Right Atrium: Right atrial size was normal in size Pericardium: There is no evidence of pericardial effusion. Mitral Valve: The mitral valve is normal in structure. Mild mitral annular calcification. No evidence of mitral valve regurgitation. No evidence of mitral valve stenosis by observation. Tricuspid Valve: The tricuspid valve is normal in structure. Tricuspid valve regurgitation is trivial. Aortic Valve: The aortic valve is normal in structure. Aortic valve regurgitation is not visualized. Mild aortic valve sclerosis is present, with no evidence of aortic valve stenosis. Pulmonic Valve: The pulmonic valve was normal in structure. Pulmonic valve regurgitation is not visualized. Pulmonic regurgitation is not visualized.  Aorta: The aortic root, ascending aorta and aortic arch are all structurally normal, with no evidence of dilitation or obstruction. Venous: The inferior vena cava is normal in size with greater than 50%  respiratory variability, suggesting right atrial pressure of 3 mmHg. IAS/Shunts: No atrial level shunt detected by color flow Doppler. There is no evidence of a patent foramen ovale. No ventricular septal defect is seen or detected. There is no evidence of an atrial septal defect.  LEFT VENTRICLE PLAX 2D LVIDd:         3.30 cm Diastology LVIDs:         2.40 cm LV e' lateral:   4.79 cm/s LV PW:         1.20 cm LV E/e' lateral: 15.4 LV IVS:        1.20 cm LV e' medial:    5.44 cm/s LV SV:         24 ml   LV E/e' medial:  13.5 LV SV Index:   12.30  LEFT ATRIUM           Index       RIGHT ATRIUM           Index LA diam:      3.10 cm 1.64 cm/m  RA Area:     15.50 cm LA Vol (A2C): 37.1 ml 19.59 ml/m RA Volume:   40.20 ml  21.22 ml/m LA Vol (A4C): 65.1 ml 34.37 ml/m   AORTA Ao Root diam: 3.30 cm MITRAL VALVE                        TRICUSPID VALVE MV Area (PHT): 2.80 cm             TR Peak grad:   25.4 mmHg MV PHT:        78.59 msec           TR Vmax:        252.00 cm/s MV Decel Time: 271 msec MV E velocity: 73.70 cm/s 103 cm/s MV A velocity: 68.60 cm/s 70.3 cm/s MV E/A ratio:  1.07       1.5  Mihai Croitoru MD Electronically signed by Sanda Klein MD Signature Date/Time: 10/27/2019/6:16:16 PM    Final      TODAY-DAY OF DISCHARGE:  Subjective:   Chloe Ramsey today remains pleasantly confused.  Objective:   Blood pressure 134/90, pulse 78, temperature (!) 96.8 F (36 C), temperature source Axillary, resp. rate 20, height 5\' 6"  (1.676 m), weight 79.8 kg, SpO2 98 %.  Intake/Output Summary (Last 24 hours) at 10/31/2019 1118 Last data filed at 10/31/2019 0940 Gross per 24 hour  Intake 2100.19 ml  Output 500 ml  Net 1600.19 ml   Filed Weights   10/27/19 1023 10/27/19 1033  Weight: 54.4 kg 79.8 kg    Exam: Pleasantly confused, No new F.N deficits [place].AT,PERRAL Supple Neck,No JVD, No cervical lymphadenopathy appriciated.  Symmetrical Chest wall movement, Good air movement bilaterally, CTAB RRR,No  Gallops,Rubs or new Murmurs, No Parasternal Heave +ve B.Sounds, Abd Soft, Non tender, No organomegaly appriciated, No rebound -guarding or rigidity. No Cyanosis, Clubbing or edema, No new Rash or bruise   PERTINENT RADIOLOGIC STUDIES: NM Pulmonary Perfusion  Result Date: 10/27/2019 CLINICAL DATA:  Hypoxia.  Elevated D-dimer EXAM: NUCLEAR MEDICINE PERFUSION LUNG SCAN TECHNIQUE: Perfusion images were obtained in multiple projections after intravenous injection of radiopharmaceutical. Ventilation scans intentionally deferred if perfusion scan and chest x-ray adequate for interpretation during  COVID 19 epidemic. Views: Anterior, posterior, left lateral, right lateral, RPO, LPO, RAO, LAO RADIOPHARMACEUTICALS:  1.54 mCi Tc-61m MAA IV COMPARISON:  Chest radiograph October 27, 2019 FINDINGS: Radiotracer uptake is homogeneous and symmetric bilaterally. No perfusion defects evident. IMPRESSION: No demonstrable perfusion defects. Very low probability of pulmonary embolus. Electronically Signed   By: Lowella Grip III M.D.   On: 10/27/2019 16:28   US Renal  Result Date: 10/28/2019 CLINICAL DATA:  Acute kidney injury, history hypertension, coronary artery disease, CHF, diabetes mellitus, COVID-19 EXAM: RENAL / URINARY TRACT ULTRASOUND COMPLETE COMPARISON:  None FINDINGS: Right Kidney: Renal measurements: 11.4 x 4.6 x 5.5 cm = volume: 152 mL. Significant cortical thinning. Mildly increased cortical echogenicity. Hypoechoic nodule likely small cyst at upper pole 16 x 16 x 14 mm. Additional tiny hypoechoic nodule at inferior pole likely an additional exophytic cyst 10 x 13 x 9 mm, containing a few scattered internal echoes. No additional mass or hydronephrosis. No shadowing calculi. Left Kidney: Renal measurements: 10.6 x 5.6 x 4.4 cm = volume: 134 mL. Cortical thinning. Increased cortical echogenicity. Exophytic cyst at inferior pole 1.6 x 1.6 x 1.8 cm. Additional exophytic cyst at upper pole, simple character, 2.6  x 3.1 x 2.9 cm. No additional mass or hydronephrosis. No shadowing calculi. Bladder: Appears normal for degree of bladder distention. Other: N/A IMPRESSION: Cortical atrophy and medical renal disease changes of both kidneys. BILATERAL renal cysts. No evidence of hydronephrosis. Electronically Signed   By: Lavonia Dana M.D.   On: 10/28/2019 16:45   Portable chest 1 View  Result Date: 10/29/2019 CLINICAL DATA:  Shortness of breath, pneumonia. EXAM: PORTABLE CHEST 1 VIEW COMPARISON:  October 27, 2019. FINDINGS: Stable cardiomegaly. Atherosclerosis of thoracic aorta is noted. No pneumothorax is noted. Stable bilateral lung opacities are noted concerning for multifocal pneumonia. Bony thorax is unremarkable. IMPRESSION: Aortic atherosclerosis. Stable bilateral lung opacities are noted concerning for multifocal pneumonia. Electronically Signed   By: Marijo Conception M.D.   On: 10/29/2019 08:21   DG Chest Port 1 View  Result Date: 10/27/2019 CLINICAL DATA:  COVID positive.  Hypoxia EXAM: PORTABLE CHEST 1 VIEW COMPARISON:  None FINDINGS: Bibasilar infiltrates compatible with pneumonia. Negative for heart failure or effusion. Atherosclerotic aorta. Chronic appearing rib fractures on the right. Degenerative change in the right shoulder. IMPRESSION: Bibasilar infiltrates consistent with pneumonia Electronically Signed   By: Franchot Gallo M.D.   On: 10/27/2019 11:07   ECHOCARDIOGRAM LIMITED  Result Date: 10/27/2019   ECHOCARDIOGRAM REPORT   Patient Name:   Chloe Ramsey Date of Exam: 10/27/2019 Medical Rec #:  [id number]  Height:       66.0 in Accession #:    [id number] Weight:       176.0 lb Date of Birth:  January 15, 1930  BSA:          1.89 m Patient Age:    44 years   BP:           95/44 mmHg Patient Gender: F          HR:           90 bpm. Exam Location:  Inpatient Procedure: Limited Echo STAT ECHO Indications:    Congestive heart Failure I50.31  History:        Patient has no prior history of Echocardiogram examinations.                  CHF, CAD, Covid-19 Positive; Risk Factors:Hypertension,  Dyslipidemia and Diabetes.  Sonographer:    Mikki Santee RDCS (AE) Referring Phys: Natalia  1. Left ventricular ejection fraction, by visual estimation, is 60 to 65%. The left ventricle has normal function. There is mildly increased left ventricular hypertrophy.  2. Elevated left atrial pressure.  3. Left ventricular diastolic parameters are consistent with Grade II diastolic dysfunction (pseudonormalization).  4. The left ventricle has no regional wall motion abnormalities.  5. Global right ventricle has normal systolic function.The right ventricular size is normal. No increase in right ventricular wall thickness.  6. Left atrial size was mildly dilated.  7. Right atrial size was normal.  8. Mild mitral annular calcification.  9. The mitral valve is normal in structure. No evidence of mitral valve regurgitation. No evidence of mitral stenosis. 10. The tricuspid valve is normal in structure. 11. The aortic valve is normal in structure. Aortic valve regurgitation is not visualized. Mild aortic valve sclerosis without stenosis. 12. The pulmonic valve was normal in structure. Pulmonic valve regurgitation is not visualized. 13. Normal pulmonary artery systolic pressure. 14. The inferior vena cava is normal in size with greater than 50% respiratory variability, suggesting right atrial pressure of 3 mmHg. FINDINGS  Left Ventricle: Left ventricular ejection fraction, by visual estimation, is 60 to 65%. The left ventricle has normal function. The left ventricle has no regional wall motion abnormalities. There is mildly increased left ventricular hypertrophy. Concentric left ventricular hypertrophy. Left ventricular diastolic parameters are consistent with Grade II diastolic dysfunction (pseudonormalization). Elevated left atrial pressure. Right Ventricle: The right ventricular size is normal. No increase in  right ventricular wall thickness. Global RV systolic function is has normal systolic function. The tricuspid regurgitant velocity is 2.52 m/s, and with an assumed right atrial pressure  of 3 mmHg, the estimated right ventricular systolic pressure is normal at 28.4 mmHg. Left Atrium: Left atrial size was mildly dilated. Right Atrium: Right atrial size was normal in size Pericardium: There is no evidence of pericardial effusion. Mitral Valve: The mitral valve is normal in structure. Mild mitral annular calcification. No evidence of mitral valve regurgitation. No evidence of mitral valve stenosis by observation. Tricuspid Valve: The tricuspid valve is normal in structure. Tricuspid valve regurgitation is trivial. Aortic Valve: The aortic valve is normal in structure. Aortic valve regurgitation is not visualized. Mild aortic valve sclerosis is present, with no evidence of aortic valve stenosis. Pulmonic Valve: The pulmonic valve was normal in structure. Pulmonic valve regurgitation is not visualized. Pulmonic regurgitation is not visualized. Aorta: The aortic root, ascending aorta and aortic arch are all structurally normal, with no evidence of dilitation or obstruction. Venous: The inferior vena cava is normal in size with greater than 50% respiratory variability, suggesting right atrial pressure of 3 mmHg. IAS/Shunts: No atrial level shunt detected by color flow Doppler. There is no evidence of a patent foramen ovale. No ventricular septal defect is seen or detected. There is no evidence of an atrial septal defect.  LEFT VENTRICLE PLAX 2D LVIDd:         3.30 cm Diastology LVIDs:         2.40 cm LV e' lateral:   4.79 cm/s LV PW:         1.20 cm LV E/e' lateral: 15.4 LV IVS:        1.20 cm LV e' medial:    5.44 cm/s LV SV:         24 ml   LV E/e' medial:  13.5  LV SV Index:   12.30  LEFT ATRIUM           Index       RIGHT ATRIUM           Index LA diam:      3.10 cm 1.64 cm/m  RA Area:     15.50 cm LA Vol (A2C): 37.1  ml 19.59 ml/m RA Volume:   40.20 ml  21.22 ml/m LA Vol (A4C): 65.1 ml 34.37 ml/m   AORTA Ao Root diam: 3.30 cm MITRAL VALVE                        TRICUSPID VALVE MV Area (PHT): 2.80 cm             TR Peak grad:   25.4 mmHg MV PHT:        78.59 msec           TR Vmax:        252.00 cm/s MV Decel Time: 271 msec MV E velocity: 73.70 cm/s 103 cm/s MV A velocity: 68.60 cm/s 70.3 cm/s MV E/A ratio:  1.07       1.5  Mihai Croitoru MD Electronically signed by Sanda Klein MD Signature Date/Time: 10/27/2019/6:16:16 PM    Final      PERTINENT LAB RESULTS: CBC: Recent Labs    10/30/19 0119 10/31/19 0100  WBC 16.1* 12.5*  HGB 12.0 13.9  HCT 36.7 42.3  PLT 229 241   CMET CMP     Component Value Date/Time   NA 140 10/31/2019 0100   K 4.1 10/31/2019 0100   CL 104 10/31/2019 0100   CO2 26 10/31/2019 0100   GLUCOSE 177 (H) 10/31/2019 0100   BUN 52 (H) 10/31/2019 0100   CREATININE 0.91 10/31/2019 0100   CALCIUM 10.0 10/31/2019 0100   PROT 5.4 (L) 10/31/2019 0100   ALBUMIN 2.5 (L) 10/31/2019 0100   AST 21 10/31/2019 0100   ALT 20 10/31/2019 0100   ALKPHOS 80 10/31/2019 0100   BILITOT 1.1 10/31/2019 0100   GFRNONAA 57 (L) 10/31/2019 0100   GFRAA >60 10/31/2019 0100    GFR Estimated Creatinine Clearance: 46.4 mL/min (by C-G formula based on SCr of 0.91 mg/dL). No results for input(s): LIPASE, AMYLASE in the last 72 hours. No results for input(s): CKTOTAL, CKMB, CKMBINDEX, TROPONINI in the last 72 hours. Invalid input(s): POCBNP Recent Labs    10/30/19 0119 10/31/19 0100  DDIMER 0.53* 0.64*   No results for input(s): HGBA1C in the last 72 hours. No results for input(s): CHOL, HDL, LDLCALC, TRIG, CHOLHDL, LDLDIRECT in the last 72 hours. No results for input(s): TSH, T4TOTAL, T3FREE, THYROIDAB in the last 72 hours.  Invalid input(s): FREET3 Recent Labs    10/30/19 0119 10/31/19 0100  FERRITIN 234 193   Coags: No results for input(s): INR in the last 72 hours.  Invalid  input(s): PT Microbiology: Recent Results (from the past 240 hour(s))  Blood Culture (routine x 2)     Status: Abnormal   Collection Time: 10/27/19 10:32 AM   Specimen: BLOOD  Result Value Ref Range Status   Specimen Description BLOOD BLOOD RIGHT FOREARM  Final   Special Requests   Final    BOTTLES DRAWN AEROBIC AND ANAEROBIC Blood Culture adequate volume   Culture  Setup Time   Final    GRAM NEGATIVE RODS IN BOTH AEROBIC AND ANAEROBIC BOTTLES CRITICAL VALUE NOTED.  VALUE IS CONSISTENT WITH PREVIOUSLY REPORTED AND  CALLED VALUE.    Culture (A)  Final    KLEBSIELLA PNEUMONIAE SUSCEPTIBILITIES PERFORMED ON PREVIOUS CULTURE WITHIN THE LAST 5 DAYS. Performed at [facility], [place] [address] [place], [place] [address]    Report Status 10/30/2019 FINAL  Final  Blood Culture (routine x 2)     Status: Abnormal   Collection Time: 10/27/19 10:36 AM   Specimen: BLOOD  Result Value Ref Range Status   Specimen Description BLOOD LEFT ANTECUBITAL  Final   Special Requests   Final    BOTTLES DRAWN AEROBIC AND ANAEROBIC Blood Culture results may not be optimal due to an inadequate volume of blood received in culture bottles   Culture  Setup Time   Final    GRAM NEGATIVE RODS IN BOTH AEROBIC AND ANAEROBIC BOTTLES CRITICAL RESULT CALLED TO, READ BACK BY AND VERIFIED WITH: K.AMEND,PHARMD AT 0205 ON 10/28/19 BY G.MCADOO Performed at [facility], [place] [address], [place], [place] [address]    Culture KLEBSIELLA PNEUMONIAE (A)  Final   Report Status 10/30/2019 FINAL  Final   Organism ID, Bacteria KLEBSIELLA PNEUMONIAE  Final      Susceptibility   Klebsiella pneumoniae - MIC*    AMPICILLIN RESISTANT Resistant     CEFAZOLIN <=4 SENSITIVE Sensitive     CEFEPIME <=0.12 SENSITIVE Sensitive     CEFTAZIDIME <=1 SENSITIVE Sensitive     CEFTRIAXONE <=0.25 SENSITIVE Sensitive     CIPROFLOXACIN <=0.25 SENSITIVE Sensitive     GENTAMICIN <=1 SENSITIVE Sensitive     IMIPENEM <=0.25 SENSITIVE  Sensitive     TRIMETH/SULFA <=20 SENSITIVE Sensitive     AMPICILLIN/SULBACTAM 4 SENSITIVE Sensitive     PIP/TAZO <=4 SENSITIVE Sensitive     * KLEBSIELLA PNEUMONIAE  Blood Culture ID Panel (Reflexed)     Status: Abnormal   Collection Time: 10/27/19 10:36 AM  Result Value Ref Range Status   Enterococcus species NOT DETECTED NOT DETECTED Final   Listeria monocytogenes NOT DETECTED NOT DETECTED Final   Staphylococcus species NOT DETECTED NOT DETECTED Final   Staphylococcus aureus (BCID) NOT DETECTED NOT DETECTED Final   Streptococcus species NOT DETECTED NOT DETECTED Final   Streptococcus agalactiae NOT DETECTED NOT DETECTED Final   Streptococcus pneumoniae NOT DETECTED NOT DETECTED Final   Streptococcus pyogenes NOT DETECTED NOT DETECTED Final   Acinetobacter baumannii NOT DETECTED NOT DETECTED Final   Enterobacteriaceae species DETECTED (A) NOT DETECTED Final    Comment: Enterobacteriaceae represent a large family of gram-negative bacteria, not a single organism. CRITICAL RESULT CALLED TO, READ BACK BY AND VERIFIED WITH: K.AMEND,PHARMD AT 0205 ON 10/28/19 BY G.MCADOO    Enterobacter cloacae complex NOT DETECTED NOT DETECTED Final   Escherichia coli NOT DETECTED NOT DETECTED Final   Klebsiella oxytoca NOT DETECTED NOT DETECTED Final   Klebsiella pneumoniae DETECTED (A) NOT DETECTED Final    Comment: CRITICAL RESULT CALLED TO, READ BACK BY AND VERIFIED WITH: K.AMEND,PHARMD AT 0205 ON 10/28/19 BY G.MCADOO    Proteus species NOT DETECTED NOT DETECTED Final   Serratia marcescens NOT DETECTED NOT DETECTED Final   Carbapenem resistance NOT DETECTED NOT DETECTED Final   Haemophilus influenzae NOT DETECTED NOT DETECTED Final   Neisseria meningitidis NOT DETECTED NOT DETECTED Final   Pseudomonas aeruginosa NOT DETECTED NOT DETECTED Final   Candida albicans NOT DETECTED NOT DETECTED Final   Candida glabrata NOT DETECTED NOT DETECTED Final   Candida krusei NOT DETECTED NOT DETECTED Final    Candida parapsilosis NOT DETECTED NOT DETECTED Final  Candida tropicalis NOT DETECTED NOT DETECTED Final    Comment: Performed at [facility], [place] [address] [place], [place] [address]  Respiratory Panel by RT PCR (Flu A&B, Covid) - Nasopharyngeal Swab     Status: Abnormal   Collection Time: 10/27/19 11:43 AM   Specimen: Nasopharyngeal Swab  Result Value Ref Range Status   SARS Coronavirus 2 by RT PCR POSITIVE (A) NEGATIVE Final    Comment: RESULT CALLED TO, READ BACK BY AND VERIFIED WITH: Alesia Banda RN 13:25 10/27/19 ([email or web address]) (NOTE) SARS-CoV-2 target nucleic acids are DETECTED. SARS-CoV-2 RNA is generally detectable in upper respiratory specimens  during the acute phase of infection. Positive results are indicative of the presence of the identified virus, but do not rule out bacterial infection or co-infection with other pathogens not detected by the test. Clinical correlation with patient history and other diagnostic information is necessary to determine patient infection status. The expected result is Negative. Fact Sheet for Patients:  PinkCheek.be Fact Sheet for Healthcare Providers: GravelBags.it This test is not yet approved or cleared by the [place] FDA and  has been authorized for detection and/or diagnosis of SARS-CoV-2 by FDA under an Emergency Use Authorization (EUA).  This EUA will remain in effect (meaning this test can be used)  for the duration of  the COVID-19 declaration under Section 564(b)(1) of the Act, 21 U.S.C. section 360bbb-3(b)(1), unless the authorization is terminated or revoked sooner.    Influenza A by PCR NEGATIVE NEGATIVE Final   Influenza B by PCR NEGATIVE NEGATIVE Final    Comment: (NOTE) The Xpert Xpress SARS-CoV-2/FLU/RSV assay is intended as an aid in  the diagnosis of influenza from Nasopharyngeal swab specimens and  should not be used as a sole basis for treatment. Nasal  washings and  aspirates are unacceptable for Xpert Xpress SARS-CoV-2/FLU/RSV  testing. Fact Sheet for Patients: PinkCheek.be Fact Sheet for Healthcare Providers: GravelBags.it This test is not yet approved or cleared by the [place] FDA and  has been authorized for detection and/or diagnosis of SARS-CoV-2 by  FDA under an Emergency Use Authorization (EUA). This EUA will remain  in effect (meaning this test can be used) for the duration of the  Covid-19 declaration under Section 564(b)(1) of the Act, 21  U.S.C. section 360bbb-3(b)(1), unless the authorization is  terminated or revoked. Performed at [facility], [place] [address] [place], [place] [address]   Urine culture     Status: None   Collection Time: 10/28/19  1:08 PM   Specimen: In/Out Cath Urine  Result Value Ref Range Status   Specimen Description   Final    IN/OUT CATH URINE Performed at [place] [address] [place], [place] [address]    Special Requests   Final    NONE Performed at [facility], [place] [address] [place], [place] [address]    Culture   Final    NO GROWTH Performed at [facility], [place] [address], [place], [place] [address]    Report Status 10/29/2019 FINAL  Final    FURTHER DISCHARGE INSTRUCTIONS:  Get Medicines reviewed and adjusted: Please take all your medications with you for your next visit with your Primary MD  Laboratory/radiological data: Please request your Primary MD to go over all hospital tests and procedure/radiological results at the follow up, please ask your Primary MD to get all Hospital records sent to his/her office.  In some cases, they will be blood work,  cultures and biopsy results pending at the time of your discharge. Please request that your primary care M.D. goes through all the records of your hospital data and follows up on these results.  Also Note  the following: If you experience worsening of your admission symptoms, develop shortness of breath, life threatening emergency, suicidal or homicidal thoughts you must seek medical attention immediately by calling 911 or calling your MD immediately  if symptoms less severe.  You must read complete instructions/literature along with all the possible adverse reactions/side effects for all the Medicines you take and that have been prescribed to you. Take any new Medicines after you have completely understood and accpet all the possible adverse reactions/side effects.   Do not drive when taking Pain medications or sleeping medications (Benzodaizepines)  Do not take more than prescribed Pain, Sleep and Anxiety Medications. It is not advisable to combine anxiety,sleep and pain medications without talking with your primary care practitioner  Special Instructions: If you have smoked or chewed Tobacco  in the last 2 yrs please stop smoking, stop any regular Alcohol  and or any Recreational drug use.  Wear Seat belts while driving.  Please note: You were cared for by a hospitalist during your hospital stay. Once you are discharged, your primary care physician will handle any further medical issues. Please note that NO REFILLS for any discharge medications will be authorized once you are discharged, as it is imperative that you return to your primary care physician (or establish a relationship with a primary care physician if you do not have one) for your post hospital discharge needs so that they can reassess your need for medications and monitor your lab values.  Total Time spent coordinating discharge including counseling, education and face to face time equals  45 minutes.  SignedOren Binet 10/31/2019 11:18 AM

## 2019-11-02 LAB — GLUCOSE, CAPILLARY: Glucose-Capillary: 95 mg/dL (ref 70–99)

## 2019-11-07 ENCOUNTER — Inpatient Hospital Stay (HOSPITAL_COMMUNITY)
Admission: EM | Admit: 2019-11-07 | Discharge: 2019-11-16 | DRG: 175 | Disposition: E | Payer: Medicare HMO | Source: Skilled Nursing Facility | Attending: Internal Medicine | Admitting: Internal Medicine

## 2019-11-07 ENCOUNTER — Other Ambulatory Visit: Payer: Self-pay

## 2019-11-07 ENCOUNTER — Emergency Department (HOSPITAL_COMMUNITY): Payer: Medicare HMO

## 2019-11-07 ENCOUNTER — Inpatient Hospital Stay (HOSPITAL_COMMUNITY): Payer: Medicare HMO

## 2019-11-07 DIAGNOSIS — R7881 Bacteremia: Secondary | ICD-10-CM | POA: Diagnosis not present

## 2019-11-07 DIAGNOSIS — I5032 Chronic diastolic (congestive) heart failure: Secondary | ICD-10-CM | POA: Diagnosis present

## 2019-11-07 DIAGNOSIS — J1282 Pneumonia due to coronavirus disease 2019: Secondary | ICD-10-CM | POA: Diagnosis not present

## 2019-11-07 DIAGNOSIS — Z888 Allergy status to other drugs, medicaments and biological substances status: Secondary | ICD-10-CM | POA: Diagnosis not present

## 2019-11-07 DIAGNOSIS — Z7901 Long term (current) use of anticoagulants: Secondary | ICD-10-CM | POA: Diagnosis not present

## 2019-11-07 DIAGNOSIS — E860 Dehydration: Secondary | ICD-10-CM | POA: Diagnosis not present

## 2019-11-07 DIAGNOSIS — I2510 Atherosclerotic heart disease of native coronary artery without angina pectoris: Secondary | ICD-10-CM | POA: Diagnosis present

## 2019-11-07 DIAGNOSIS — K449 Diaphragmatic hernia without obstruction or gangrene: Secondary | ICD-10-CM | POA: Diagnosis present

## 2019-11-07 DIAGNOSIS — Z8711 Personal history of peptic ulcer disease: Secondary | ICD-10-CM | POA: Diagnosis not present

## 2019-11-07 DIAGNOSIS — I248 Other forms of acute ischemic heart disease: Secondary | ICD-10-CM | POA: Diagnosis present

## 2019-11-07 DIAGNOSIS — R4182 Altered mental status, unspecified: Secondary | ICD-10-CM | POA: Diagnosis not present

## 2019-11-07 DIAGNOSIS — Z66 Do not resuscitate: Secondary | ICD-10-CM

## 2019-11-07 DIAGNOSIS — F0391 Unspecified dementia with behavioral disturbance: Secondary | ICD-10-CM

## 2019-11-07 DIAGNOSIS — J9601 Acute respiratory failure with hypoxia: Secondary | ICD-10-CM | POA: Diagnosis present

## 2019-11-07 DIAGNOSIS — E1122 Type 2 diabetes mellitus with diabetic chronic kidney disease: Secondary | ICD-10-CM | POA: Diagnosis present

## 2019-11-07 DIAGNOSIS — R339 Retention of urine, unspecified: Secondary | ICD-10-CM | POA: Diagnosis present

## 2019-11-07 DIAGNOSIS — Z6828 Body mass index (BMI) 28.0-28.9, adult: Secondary | ICD-10-CM

## 2019-11-07 DIAGNOSIS — B948 Sequelae of other specified infectious and parasitic diseases: Secondary | ICD-10-CM | POA: Diagnosis not present

## 2019-11-07 DIAGNOSIS — F03918 Unspecified dementia, unspecified severity, with other behavioral disturbance: Secondary | ICD-10-CM | POA: Diagnosis present

## 2019-11-07 DIAGNOSIS — I2699 Other pulmonary embolism without acute cor pulmonale: Secondary | ICD-10-CM | POA: Diagnosis not present

## 2019-11-07 DIAGNOSIS — E1165 Type 2 diabetes mellitus with hyperglycemia: Secondary | ICD-10-CM | POA: Diagnosis present

## 2019-11-07 DIAGNOSIS — R778 Other specified abnormalities of plasma proteins: Secondary | ICD-10-CM | POA: Diagnosis present

## 2019-11-07 DIAGNOSIS — I130 Hypertensive heart and chronic kidney disease with heart failure and stage 1 through stage 4 chronic kidney disease, or unspecified chronic kidney disease: Secondary | ICD-10-CM | POA: Diagnosis present

## 2019-11-07 DIAGNOSIS — I452 Bifascicular block: Secondary | ICD-10-CM | POA: Diagnosis present

## 2019-11-07 DIAGNOSIS — Z955 Presence of coronary angioplasty implant and graft: Secondary | ICD-10-CM | POA: Diagnosis not present

## 2019-11-07 DIAGNOSIS — E119 Type 2 diabetes mellitus without complications: Secondary | ICD-10-CM | POA: Diagnosis not present

## 2019-11-07 DIAGNOSIS — E785 Hyperlipidemia, unspecified: Secondary | ICD-10-CM | POA: Diagnosis present

## 2019-11-07 DIAGNOSIS — R6510 Systemic inflammatory response syndrome (SIRS) of non-infectious origin without acute organ dysfunction: Secondary | ICD-10-CM | POA: Diagnosis present

## 2019-11-07 DIAGNOSIS — G9340 Encephalopathy, unspecified: Secondary | ICD-10-CM | POA: Diagnosis not present

## 2019-11-07 DIAGNOSIS — Z794 Long term (current) use of insulin: Secondary | ICD-10-CM

## 2019-11-07 DIAGNOSIS — I5043 Acute on chronic combined systolic (congestive) and diastolic (congestive) heart failure: Secondary | ICD-10-CM | POA: Diagnosis present

## 2019-11-07 DIAGNOSIS — Z515 Encounter for palliative care: Secondary | ICD-10-CM | POA: Diagnosis not present

## 2019-11-07 DIAGNOSIS — G9341 Metabolic encephalopathy: Secondary | ICD-10-CM | POA: Diagnosis present

## 2019-11-07 DIAGNOSIS — E43 Unspecified severe protein-calorie malnutrition: Secondary | ICD-10-CM | POA: Diagnosis present

## 2019-11-07 DIAGNOSIS — N184 Chronic kidney disease, stage 4 (severe): Secondary | ICD-10-CM | POA: Diagnosis present

## 2019-11-07 DIAGNOSIS — R0602 Shortness of breath: Secondary | ICD-10-CM

## 2019-11-07 DIAGNOSIS — B961 Klebsiella pneumoniae [K. pneumoniae] as the cause of diseases classified elsewhere: Secondary | ICD-10-CM | POA: Diagnosis present

## 2019-11-07 DIAGNOSIS — Y95 Nosocomial condition: Secondary | ICD-10-CM | POA: Diagnosis present

## 2019-11-07 DIAGNOSIS — U071 COVID-19: Secondary | ICD-10-CM | POA: Diagnosis not present

## 2019-11-07 DIAGNOSIS — Z880 Allergy status to penicillin: Secondary | ICD-10-CM | POA: Diagnosis not present

## 2019-11-07 DIAGNOSIS — J988 Other specified respiratory disorders: Secondary | ICD-10-CM | POA: Diagnosis present

## 2019-11-07 DIAGNOSIS — J189 Pneumonia, unspecified organism: Secondary | ICD-10-CM | POA: Diagnosis present

## 2019-11-07 LAB — CBC WITH DIFFERENTIAL/PLATELET
Abs Immature Granulocytes: 0.15 10*3/uL — ABNORMAL HIGH (ref 0.00–0.07)
Basophils Absolute: 0 10*3/uL (ref 0.0–0.1)
Basophils Relative: 0 %
Eosinophils Absolute: 0.1 10*3/uL (ref 0.0–0.5)
Eosinophils Relative: 1 %
HCT: 41.3 % (ref 36.0–46.0)
Hemoglobin: 13.4 g/dL (ref 12.0–15.0)
Immature Granulocytes: 1 %
Lymphocytes Relative: 6 %
Lymphs Abs: 0.9 10*3/uL (ref 0.7–4.0)
MCH: 31.5 pg (ref 26.0–34.0)
MCHC: 32.4 g/dL (ref 30.0–36.0)
MCV: 96.9 fL (ref 80.0–100.0)
Monocytes Absolute: 0.8 10*3/uL (ref 0.1–1.0)
Monocytes Relative: 5 %
Neutro Abs: 13.9 10*3/uL — ABNORMAL HIGH (ref 1.7–7.7)
Neutrophils Relative %: 87 %
Platelets: 251 10*3/uL (ref 150–400)
RBC: 4.26 MIL/uL (ref 3.87–5.11)
RDW: 13.2 % (ref 11.5–15.5)
WBC: 15.9 10*3/uL — ABNORMAL HIGH (ref 4.0–10.5)
nRBC: 0 % (ref 0.0–0.2)

## 2019-11-07 LAB — COMPREHENSIVE METABOLIC PANEL
ALT: 17 U/L (ref 0–44)
AST: 30 U/L (ref 15–41)
Albumin: 2 g/dL — ABNORMAL LOW (ref 3.5–5.0)
Alkaline Phosphatase: 83 U/L (ref 38–126)
Anion gap: 9 (ref 5–15)
BUN: 35 mg/dL — ABNORMAL HIGH (ref 8–23)
CO2: 29 mmol/L (ref 22–32)
Calcium: 10 mg/dL (ref 8.9–10.3)
Chloride: 100 mmol/L (ref 98–111)
Creatinine, Ser: 1.18 mg/dL — ABNORMAL HIGH (ref 0.44–1.00)
GFR calc Af Amer: 48 mL/min — ABNORMAL LOW (ref >60)
GFR calc non Af Amer: 41 mL/min — ABNORMAL LOW (ref >60)
Glucose, Bld: 236 mg/dL — ABNORMAL HIGH (ref 70–99)
Potassium: 5.1 mmol/L (ref 3.5–5.1)
Sodium: 138 mmol/L (ref 135–145)
Total Bilirubin: 1.1 mg/dL (ref 0.3–1.2)
Total Protein: 5.2 g/dL — ABNORMAL LOW (ref 6.5–8.1)

## 2019-11-07 LAB — URINALYSIS, ROUTINE W REFLEX MICROSCOPIC
Bacteria, UA: NONE SEEN
Bilirubin Urine: NEGATIVE
Glucose, UA: 50 mg/dL — AB
Hgb urine dipstick: NEGATIVE
Ketones, ur: NEGATIVE mg/dL
Leukocytes,Ua: NEGATIVE
Nitrite: NEGATIVE
Protein, ur: 30 mg/dL — AB
Specific Gravity, Urine: 1.023 (ref 1.005–1.030)
pH: 5 (ref 5.0–8.0)

## 2019-11-07 LAB — HEMOGLOBIN A1C
Hgb A1c MFr Bld: 8.6 % — ABNORMAL HIGH (ref 4.8–5.6)
Mean Plasma Glucose: 200.12 mg/dL

## 2019-11-07 LAB — TROPONIN I (HIGH SENSITIVITY)
Troponin I (High Sensitivity): 98 ng/L — ABNORMAL HIGH (ref <18)
Troponin I (High Sensitivity): 116 ng/L (ref <18)

## 2019-11-07 LAB — LACTIC ACID, PLASMA: Lactic Acid, Venous: 1.5 mmol/L (ref 0.5–1.9)

## 2019-11-07 LAB — MRSA PCR SCREENING: MRSA by PCR: NEGATIVE

## 2019-11-07 MED ORDER — ATORVASTATIN CALCIUM 40 MG PO TABS
40.0000 mg | ORAL_TABLET | Freq: Every day | ORAL | Status: DC
  Administered 2019-11-08: 40 mg via ORAL
  Filled 2019-11-07: qty 1

## 2019-11-07 MED ORDER — INSULIN ASPART 100 UNIT/ML ~~LOC~~ SOLN
0.0000 [IU] | Freq: Three times a day (TID) | SUBCUTANEOUS | Status: DC
  Administered 2019-11-08 (×2): 3 [IU] via SUBCUTANEOUS
  Administered 2019-11-08: 2 [IU] via SUBCUTANEOUS

## 2019-11-07 MED ORDER — CARVEDILOL 12.5 MG PO TABS
12.5000 mg | ORAL_TABLET | Freq: Two times a day (BID) | ORAL | Status: DC
  Administered 2019-11-08 (×2): 12.5 mg via ORAL
  Filled 2019-11-07 (×2): qty 1

## 2019-11-07 MED ORDER — ENOXAPARIN SODIUM 40 MG/0.4ML ~~LOC~~ SOLN
40.0000 mg | SUBCUTANEOUS | Status: DC
  Administered 2019-11-08: 40 mg via SUBCUTANEOUS
  Filled 2019-11-07: qty 0.4

## 2019-11-07 MED ORDER — DONEPEZIL HCL 10 MG PO TABS
10.0000 mg | ORAL_TABLET | Freq: Every day | ORAL | Status: DC
  Administered 2019-11-08: 10 mg via ORAL
  Filled 2019-11-07: qty 1

## 2019-11-07 MED ORDER — ALBUTEROL SULFATE (2.5 MG/3ML) 0.083% IN NEBU: 2.5000 mg | INHALATION_SOLUTION | Freq: Four times a day (QID) | RESPIRATORY_TRACT | Status: DC

## 2019-11-07 MED ORDER — INSULIN ASPART 100 UNIT/ML ~~LOC~~ SOLN
0.0000 [IU] | Freq: Every day | SUBCUTANEOUS | Status: DC
  Administered 2019-11-08: 2 [IU] via SUBCUTANEOUS

## 2019-11-07 MED ORDER — MIRTAZAPINE 15 MG PO TABS
7.5000 mg | ORAL_TABLET | Freq: Every day | ORAL | Status: DC
  Administered 2019-11-08: 7.5 mg via ORAL
  Filled 2019-11-07: qty 1

## 2019-11-07 MED ORDER — ALBUTEROL SULFATE (2.5 MG/3ML) 0.083% IN NEBU
2.5000 mg | INHALATION_SOLUTION | Freq: Three times a day (TID) | RESPIRATORY_TRACT | Status: DC
  Filled 2019-11-07: qty 3

## 2019-11-07 MED ORDER — ONDANSETRON HCL 4 MG/2ML IJ SOLN: 4.0000 mg | Freq: Four times a day (QID) | INTRAMUSCULAR | Status: DC | PRN

## 2019-11-07 MED ORDER — GUAIFENESIN ER 600 MG PO TB12
600.0000 mg | ORAL_TABLET | Freq: Two times a day (BID) | ORAL | Status: DC
  Administered 2019-11-08 (×2): 600 mg via ORAL
  Filled 2019-11-07 (×2): qty 1

## 2019-11-07 MED ORDER — MIDAZOLAM HCL 2 MG/2ML IJ SOLN: 4.0000 mg | Freq: Once | INTRAMUSCULAR | Status: DC

## 2019-11-07 MED ORDER — ACETAMINOPHEN 325 MG PO TABS: 650.0000 mg | ORAL_TABLET | Freq: Four times a day (QID) | ORAL | Status: DC | PRN

## 2019-11-07 MED ORDER — ACETAMINOPHEN 650 MG RE SUPP: 650.0000 mg | Freq: Four times a day (QID) | RECTAL | Status: DC | PRN

## 2019-11-07 MED ORDER — SODIUM CHLORIDE 0.9 % IV SOLN
INTRAVENOUS | Status: DC
  Administered 2019-11-07 – 2019-11-08 (×2): 1000 mL via INTRAVENOUS

## 2019-11-07 MED ORDER — HALOPERIDOL 1 MG PO TABS
1.0000 mg | ORAL_TABLET | Freq: Three times a day (TID) | ORAL | Status: DC
  Administered 2019-11-07 – 2019-11-08 (×2): 1 mg via ORAL
  Filled 2019-11-07 (×4): qty 1

## 2019-11-07 MED ORDER — ONDANSETRON HCL 4 MG PO TABS: 4.0000 mg | ORAL_TABLET | Freq: Four times a day (QID) | ORAL | Status: DC | PRN

## 2019-11-07 NOTE — Consult Note (Signed)
Referring Physician: Vernell Morgans [place]  Chloe Ramsey is an 86 y.o. female.                       Chief Complaint: Abnormal Troponin I  HPI: 70 years white female with recent COVID-19 pneumonia has decreased activity and shortness of breath. She has good LV systolic function with EF 60-65 %. Her EKG shows SR, RBBB with RAHB and possible old inferior infarct. Her troponin I levels are minimally elevated from demand ischemia. She is unable to give history.  Past Medical History:  Diagnosis Date  . CAD (coronary artery disease)   . CHF (congestive heart failure) ([place])   . COVID-19   . Dementia ([place])   . Diabetes mellitus without complication ([place])   . H/O coronary angioplasty   . Hyperlipidemia   . Hypertension   . Peptic ulcer       No past surgical history on file.  No family history on file. Social History:  reports that she has never smoked. She has never used smokeless tobacco. She reports previous alcohol use. She reports previous drug use.  Allergies:  Allergies  Allergen Reactions  . Lopid [Gemfibrozil]   . Penicillins     (Not in a hospital admission)   Results for orders placed or performed during the hospital encounter of 11/09/2019 (from the past 48 hour(s))  CBC with Differential     Status: Abnormal   Collection Time: 11/12/2019  1:20 PM  Result Value Ref Range   WBC 15.9 (H) 4.0 - 10.5 K/uL   RBC 4.26 3.87 - 5.11 MIL/uL   Hemoglobin 13.4 12.0 - 15.0 g/dL   HCT 41.3 36.0 - 46.0 %   MCV 96.9 80.0 - 100.0 fL   MCH 31.5 26.0 - 34.0 pg   MCHC 32.4 30.0 - 36.0 g/dL   RDW 13.2 11.5 - 15.5 %   Platelets 251 150 - 400 K/uL   nRBC 0.0 0.0 - 0.2 %   Neutrophils Relative % 87 %   Neutro Abs 13.9 (H) 1.7 - 7.7 K/uL   Lymphocytes Relative 6 %   Lymphs Abs 0.9 0.7 - 4.0 K/uL   Monocytes Relative 5 %   Monocytes Absolute 0.8 0.1 - 1.0 K/uL   Eosinophils Relative 1 %   Eosinophils Absolute 0.1 0.0 - 0.5 K/uL   Basophils Relative 0 %   Basophils Absolute  0.0 0.0 - 0.1 K/uL   Immature Granulocytes 1 %   Abs Immature Granulocytes 0.15 (H) 0.00 - 0.07 K/uL    Comment: Performed at [facility], [address], [place], [place] [address]  Comprehensive metabolic panel     Status: Abnormal   Collection Time: 11/04/2019  1:20 PM  Result Value Ref Range   Sodium 138 135 - 145 mmol/L   Potassium 5.1 3.5 - 5.1 mmol/L   Chloride 100 98 - 111 mmol/L   CO2 29 22 - 32 mmol/L   Glucose, Bld 236 (H) 70 - 99 mg/dL   BUN 35 (H) 8 - 23 mg/dL   Creatinine, Ser 1.18 (H) 0.44 - 1.00 mg/dL   Calcium 10.0 8.9 - 10.3 mg/dL   Total Protein 5.2 (L) 6.5 - 8.1 g/dL   Albumin 2.0 (L) 3.5 - 5.0 g/dL   AST 30 15 - 41 U/L   ALT 17 0 - 44 U/L   Alkaline Phosphatase 83 38 - 126 U/L   Total Bilirubin 1.1 0.3 - 1.2 mg/dL  GFR calc non Af Amer 41 (L) >60 mL/min   GFR calc Af Amer 48 (L) >60 mL/min   Anion gap 9 5 - 15    Comment: Performed at [facility] [address], [place], [place] [address]  Lactic acid, plasma     Status: None   Collection Time: 10/17/2019  1:20 PM  Result Value Ref Range   Lactic Acid, Venous 1.5 0.5 - 1.9 mmol/L    Comment: Performed at [facility] [address], [place], [place] [address]  Troponin I (High Sensitivity)     Status: Abnormal   Collection Time: 10/21/2019  1:20 PM  Result Value Ref Range   Troponin I (High Sensitivity) 98 (H) <18 ng/L    Comment: (NOTE) Elevated high sensitivity troponin I (hsTnI) values and significant  changes across serial measurements may suggest ACS but many other  chronic and acute conditions are known to elevate hsTnI results.  Refer to the "Links" section for chest pain algorithms and additional  guidance. Performed at [facility], [place] [address] [place], [place] [address]    DG Chest 2 View  Result Date: 11/11/2019 CLINICAL DATA:  Altered mental status, emesis. EXAM: CHEST - 2 VIEW COMPARISON:  Chest x-rays dated 10/29/2019 and 10/27/2019. FINDINGS: Stable  cardiomegaly. Aortic atherosclerosis. Patchy airspace opacities are again seen bilaterally, not significantly changed. No new lung findings. No pleural effusion or pneumothorax is seen. Hiatal hernia is present, moderate to large in size. Kyphosis of the thoracic spine. No acute or suspicious osseous finding. IMPRESSION: No significant interval change. Patchy bilateral airspace opacities are again seen, LEFT greater than RIGHT, not significantly changed compared to the most recent chest x-ray of 10/28/2012, again suspicious for multifocal pneumonia. Electronically Signed   By: Franki Cabot M.D.   On: 10/23/2019 14:22   CT Head Wo Contrast  Result Date: 11/04/2019 CLINICAL DATA:  Altered mental status. EXAM: CT HEAD WITHOUT CONTRAST TECHNIQUE: Contiguous axial images were obtained from the base of the skull through the vertex without intravenous contrast. COMPARISON:  None. FINDINGS: Brain: Generalized age related parenchymal volume loss with commensurate dilatation of the ventricles and sulci. 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 Mild chronic small vessel ischemic changes within the bilateral periventricular white matter regions. No mass, hemorrhage, edema or other evidence of acute parenchymal abnormality. No extra-axial hemorrhage. Vascular: Chronic calcified atherosclerotic changes of the large vessels at the skull base. No unexpected hyperdense vessel. Skull: No acute findings. Sinuses/Orbits: No acute findings. Fixation hardware appears intact and appropriately positioned along the anterior walls of each maxillary sinus, overlying the nasal bones and overlying LEFT frontal sinus. Other: None IMPRESSION: 1. No acute findings. No intracranial mass, hemorrhage or edema. 2. 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 Additional chronic small vessel ischemic changes within the white matter. Electronically Signed   By: Franki Cabot M.D.   On: 10/24/2019 14:25    Review Of Systems Patient unable to give ROS. Constitutional: Positive fever, chills, weight loss. Eyes: No vision change, wears glasses. No discharge or pain. Ears: No hearing loss, No tinnitus. Respiratory: No asthma, COPD, positive pneumonias. No shortness of breath. No hemoptysis. Cardiovascular: No chest pain, palpitation, leg edema. Gastrointestinal: No nausea, vomiting, diarrhea, constipation. No GI bleed. No hepatitis. Genitourinary: No dysuria, hematuria, kidney stone. No incontinance. Neurological: No headache, stroke, seizures.  Psychiatry: No psych facility admission for anxiety, depression, suicide. No detox.  Skin: No rash. Musculoskeletal: Positive joint pain, fibromyalgia. No neck pain, back pain. Lymphadenopathy: No lymphadenopathy. Hematology: No anemia or easy bruising.   Blood pressure 124/70, pulse 88, temperature 98.2 F (36.8 C), temperature source Rectal, resp. rate (!) 22, height 5\' 6"  (1.676 m), weight 79 kg, SpO2 100 %. Body mass index is 28.11 kg/m. General appearance: awake, cooperative, appears stated age and no distress Head: Normocephalic, atraumatic. Eyes: Blue eyes, pink conjunctiva, corneas clear. Neck: No adenopathy, no carotid bruit, no JVD, supple, symmetrical, trachea midline and thyroid not enlarged. Resp: Clear to auscultation bilaterally. Cardio: Regular rate and rhythm, S1, S2 normal, II/VI systolic murmur, no click, rub or gallop GI: Soft, non-tender; bowel sounds normal; no organomegaly. Extremities: No edema, cyanosis or clubbing. Skin: Warm and dry.  Neurologic: Alert and oriented X 0.  Assessment/Plan Altered mental status R/O stroke Abnormal Troponin I from demand ischemia S/P COVID-19 pneumonia CAD S/P stent RBBB and LAHB Type 2 DM with hyperglycemia Hyperlipidemia Hypertension  Patient is not a candidate for cardiac interventions. Continue medical therapy.  Time spent: Review of  old records, Lab, x-rays, EKG, other cardiac tests, examination, discussion with patient or physician assistant over 70 minutes.  Birdie Riddle, MD  11/03/2019, 3:53 PM

## 2019-11-07 NOTE — Progress Notes (Signed)
Patient went to MRI.

## 2019-11-07 NOTE — ED Provider Notes (Signed)
[facility] EMERGENCY DEPARTMENT Provider Note   CSN: [id number] Arrival date & time: 10/16/2019  1215     History Chief Complaint  Patient presents with  . Altered Mental Status    Chloe Ramsey is a 86 y.o. female presenting for altered mental status.  Level 5 caveat due to dementia and patient being nonverbal.  History obtained from [facility] facility staff.  They state patient was discharged in the hospital 1 week ago after being admitted for Covid.  Since discharge, patient has been weaker than her normal baseline.  Prior to hospitalization, she could walk with a walker and would respond to questions.  Since discharge, she has not ambulated, and been much more tired.  However she was answering questions several days ago, but today she was not.  She appears more lethargic today.  Yesterday she started to develop urinary retention, she has not urinated since yesterday.  Today she had one episode of emesis which had red (blood?).  He has not had any fevers at the facility.  Staff states she has not been eating, would not eat breakfast today.  Her Lasix was discontinued 3 days ago.  She is not on Eliquis or another blood thinner.  Additional history obtained from patient's son.  He reports patient has a history of hypertension, PUD and previous GI bleed requiring blood transfusion, 3 cardiac stents placed.  Had a history of diabetes, but was taken off Metformin after A1c was improving.  Son reports no previous history of stroke.  Son states is usually ambulatory, very talkative, and interactive.   HPI     Past Medical History:  Diagnosis Date  . CAD (coronary artery disease)   . CHF (congestive heart failure) ([place])   . COVID-19   . Dementia ([place])   . Diabetes mellitus without complication ([place])   . H/O coronary angioplasty   . Hyperlipidemia   . Hypertension   . Peptic ulcer     Patient Active Problem List   Diagnosis Date Noted  . Type 2 diabetes mellitus  with stage 4 chronic kidney disease ([place]) 10/27/2019  . Hypertension complicating diabetes ([place]) 10/27/2019  . Acute on chronic combined systolic and diastolic CHF (congestive heart failure) ([place]) 10/27/2019  . Pneumonia due to COVID-19 virus 10/27/2019  . HAP (hospital-acquired pneumonia) 10/27/2019    No past surgical history on file.   OB History   No obstetric history on file.     No family history on file.  Social History   Tobacco Use  . Smoking status: Never Smoker  . Smokeless tobacco: Never Used  . Tobacco comment: n/a  Substance Use Topics  . Alcohol use: Not Currently    Comment: n/a  . Drug use: Not Currently    Comment: n/a    Home Medications Prior to Admission medications   Medication Sig Start Date End Date Taking? Authorizing Provider  acetaminophen (TYLENOL) 500 MG tablet Take 1,000 mg by mouth daily.    [provider]  amLODipine (NORVASC) 2.5 MG tablet Take 2.5 mg by mouth daily.    [provider]  apixaban (ELIQUIS) 2.5 MG TABS tablet Take 2.5 mg by mouth 2 (two) times daily. For 21 days, started 10-10-19    [provider]  atorvastatin (LIPITOR) 40 MG tablet Take 40 mg by mouth at bedtime.    [provider]  calcium citrate (CALCITRATE - DOSED IN MG ELEMENTAL CALCIUM) 950 (200 Ca) MG tablet Take 200 mg of elemental  calcium by mouth daily.    [provider]  carvedilol (COREG) 12.5 MG tablet Take 12.5 mg by mouth 2 (two) times daily with a meal.    [provider]  Cholecalciferol (VITAMIN D) 50 MCG (2000 UT) CAPS Take 2,000 Units by mouth daily.    [provider]  donepezil (ARICEPT) 10 MG tablet Take 10 mg by mouth at bedtime.    [provider]  furosemide (LASIX) 20 MG tablet Take 20 mg by mouth daily.    [provider]  haloperidol (HALDOL) 1 MG tablet Take 1 mg by mouth 3 (three) times daily.    [provider]  insulin aspart (NOVOLOG) 100 UNIT/ML  injection 0-9 Units, Subcutaneous, 3 times daily with meals CBG < 70: Implement Hypoglycemia Standing Orders  CBG 70 - 120: 0 units CBG 121 - 150: 1 unit CBG 151 - 200: 2 units CBG 201 - 250: 3 units CBG 251 - 300: 5 units CBG 301 - 350: 7 units CBG 351 - 400: 9 units CBG > 400: call MD 10/31/19   Jonetta Osgood, MD  mirtazapine (REMERON) 7.5 MG tablet Take 7.5 mg by mouth at bedtime.    [provider]  tolterodine (DETROL LA) 4 MG 24 hr capsule Take 4 mg by mouth daily.    [provider]    Allergies    Lopid [gemfibrozil] and Penicillins  Review of Systems   Review of Systems  Unable to perform ROS: Dementia  Constitutional: Positive for fatigue.  Gastrointestinal: Positive for vomiting.  Genitourinary: Positive for decreased urine volume.  Neurological: Positive for weakness.    Physical Exam Updated Vital Signs BP 124/70   Pulse 88   Temp 98.2 F (36.8 C) (Rectal)   Resp (!) 22   Ht 5\' 6"  (1.676 m)   Wt 79 kg   LMP  (LMP Unknown)   SpO2 100%   BMI 28.11 kg/m   Physical Exam Vitals and nursing note reviewed.  Constitutional:      Appearance: She is well-developed. She is ill-appearing.     Comments: Appears chronically ill, grayish tint  HENT:     Head: Normocephalic and atraumatic.     Mouth/Throat:     Mouth: Mucous membranes are dry.  Eyes:     Conjunctiva/sclera: Conjunctivae normal.     Pupils: Pupils are equal, round, and reactive to light.  Cardiovascular:     Rate and Rhythm: Normal rate and regular rhythm.     Pulses: Normal pulses.  Pulmonary:     Effort: Pulmonary effort is normal. No respiratory distress.     Breath sounds: Rhonchi present. No wheezing.     Comments: Crackles in bilateral bases, worse on the left side Abdominal:     General: There is no distension.     Palpations: Abdomen is soft. There is no mass.     Tenderness: There is abdominal tenderness. There is no guarding or rebound.     Comments:  Tenderness palpation of suprapubic abdomen.  No rigidity, guarding, distention.  No CVA tenderness.  Musculoskeletal:     Cervical back: Normal range of motion and neck supple.     Right lower leg: No edema.     Left lower leg: No edema.     Comments: No significant lower leg edema  Skin:    General: Skin is warm and dry.     Capillary Refill: Capillary refill takes less than 2 seconds.  Neurological:  GCS: GCS eye subscore is 4. GCS verbal subscore is 1. GCS motor subscore is 6.     Comments: Will occasionally nod yes or no to questions.  Otherwise is not responding verbally.  Will track voice with her eyes.  Will follow simple commands. Patient able to wiggle her toes.  Unable to hold her leg up against gravity bilaterally.  Pt could/would not hold arms against gravity or squeeze hands bilaterally.      ED Results / Procedures / Treatments   Labs (all labs ordered are listed, but only abnormal results are displayed) Labs Reviewed  CBC WITH DIFFERENTIAL/PLATELET - Abnormal; Notable for the following components:      Result Value   WBC 15.9 (*)    Neutro Abs 13.9 (*)    Abs Immature Granulocytes 0.15 (*)    All other components within normal limits  COMPREHENSIVE METABOLIC PANEL - Abnormal; Notable for the following components:   Glucose, Bld 236 (*)    BUN 35 (*)    Creatinine, Ser 1.18 (*)    Total Protein 5.2 (*)    Albumin 2.0 (*)    GFR calc non Af Amer 41 (*)    GFR calc Af Amer 48 (*)    All other components within normal limits  TROPONIN I (HIGH SENSITIVITY) - Abnormal; Notable for the following components:   Troponin I (High Sensitivity) 98 (*)    All other components within normal limits  URINE CULTURE  CULTURE, BLOOD (ROUTINE X 2)  CULTURE, BLOOD (ROUTINE X 2)  LACTIC ACID, PLASMA  URINALYSIS, ROUTINE W REFLEX MICROSCOPIC  LACTIC ACID, PLASMA  TROPONIN I (HIGH SENSITIVITY)    EKG EKG Interpretation  Date/Time:  Saturday November 07 2019 12:44:01 EST  Ventricular Rate:  94 PR Interval:    QRS Duration: 137 QT Interval:  360 QTC Calculation: 451 R Axis:   -86 Text Interpretation: Sinus rhythm RBBB and LAFB Inferior infarct, old Minimal ST elevation, anterolateral leads Confirmed by Davonna Belling [phone]) on 10/20/2019 2:22:33 PM   Radiology DG Chest 2 View  Result Date: 10/19/2019 CLINICAL DATA:  Altered mental status, emesis. EXAM: CHEST - 2 VIEW COMPARISON:  Chest x-rays dated 10/29/2019 and 10/27/2019. FINDINGS: Stable cardiomegaly. Aortic atherosclerosis. Patchy airspace opacities are again seen bilaterally, not significantly changed. No new lung findings. No pleural effusion or pneumothorax is seen. Hiatal hernia is present, moderate to large in size. Kyphosis of the thoracic spine. No acute or suspicious osseous finding. IMPRESSION: No significant interval change. Patchy bilateral airspace opacities are again seen, LEFT greater than RIGHT, not significantly changed compared to the most recent chest x-ray of 10/28/2012, again suspicious for multifocal pneumonia. Electronically Signed   By: Franki Cabot M.D.   On: 10/20/2019 14:22   CT Head Wo Contrast  Result Date: 11/07/2019 CLINICAL DATA:  Altered mental status. EXAM: CT HEAD WITHOUT CONTRAST TECHNIQUE: Contiguous axial images were obtained from the base of the skull through the vertex without intravenous contrast. COMPARISON:  None. FINDINGS: Brain: Generalized age related parenchymal volume loss with commensurate dilatation of the ventricles and sulci. 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 Mild chronic small vessel ischemic changes within the bilateral periventricular white matter regions. No mass, hemorrhage, edema or other evidence of acute parenchymal abnormality. No extra-axial hemorrhage. Vascular: Chronic calcified atherosclerotic changes of the large vessels at the skull base. No unexpected hyperdense vessel. Skull: No  acute findings. Sinuses/Orbits: No acute findings. Fixation hardware appears intact and appropriately  positioned along the anterior walls of each maxillary sinus, overlying the nasal bones and overlying LEFT frontal sinus. Other: None IMPRESSION: 1. No acute findings. No intracranial mass, hemorrhage or edema. 2. 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 Additional chronic small vessel ischemic changes within the white matter. Electronically Signed   By: Franki Cabot M.D.   On: 11/12/2019 14:25    Procedures Procedures (including critical care time)  Medications Ordered in ED Medications  midazolam (VERSED) injection 4 mg (has no administration in time range)    ED Course  I have reviewed the triage vital signs and the nursing notes.  Pertinent labs & imaging results that were available during my care of the patient were reviewed by me and considered in my medical decision making (see chart for details).    MDM Rules/Calculators/A&P                      Patient presenting for evaluation of altered mental status, weakness, emesis, and decreased urination.  Physical exam shows patient who appears chronically ill.  Tender to palpation of suprapubic abdomen.  Consider UTI.  Consider continue weakness status post Covid.  Consider intracranial abnormality such as stroke or tumor due to weakness.  Will obtain labs, chest x-ray, CT of the head, urine for further evaluation.  Labs show leukocytosis at 16.  Troponin is elevated at 98, during her past admission it was staying stable at 50.  EKG shows mild changes, but no obvious STEMI.  Patient unable to tell us if she is having chest pain due to her dementia.  Will consult with cardiology.  CT head shows old strokes, but no acute abnormalities.  Will obtain MRI during her hospitalization for further evaluation.  Otherwise labs are reassuring.  Discussed with Dr. Doylene Canard from cardiology who will consult on the patient  during her admission.  Discussed with Dr. Tamala Julian from [facility] hospitalist service, who will admit the patient for altered mental status.  Final Clinical Impression(s) / ED Diagnoses Final diagnoses:  Altered mental status, unspecified altered mental status type  Elevated troponin    Rx / DC Orders ED Discharge Orders    None       Franchot Heidelberg, PA-C 10/18/2019 1530    Davonna Belling, MD 10/27/2019 2029

## 2019-11-07 NOTE — ED Notes (Signed)
Dr Tamala Julian has pt paperwork and is giving it to pharm to reconcile.

## 2019-11-07 NOTE — ED Triage Notes (Signed)
Pt presents from [facility] for AMS that began 11 days ago when she returned from hospital admission for covid+ status. [facility] staff reports that pt usually speaks more, experienced emesis x1 with rid color noted, urinary retention x2 days. Diagnosed covid+1 mo ago, sx free today per staff.

## 2019-11-07 NOTE — ED Notes (Signed)
Admitting provider notified of elevated trop.

## 2019-11-07 NOTE — ED Notes (Signed)
Critical Troponin value reported to Dr. Alvino Chapel.

## 2019-11-07 NOTE — Progress Notes (Signed)
Patient trasfered from ED to [phone] via stretcher; alert and oriented x 1; no complaints of pain; IV saline locked in RAC - fluids started at 50 ml/hr per MD order; tele monitor placed per order. Orient patient to room and unit; gave patient care guide; instructed how to use the call bell and  fall risk precautions. Will continue to monitor the patient.

## 2019-11-07 NOTE — H&P (Signed)
History and Physical    Chloe Ramsey DOB: 11-Jan-1930 DOA: 10/21/2019  Referring MD/NP/PA: Franchot Heidelberg, PA-C PCP: Hilbert Corrigan, MD  Patient coming from: [facility] via [facility]  Chief Complaint: Altered mental status  I have personally briefly reviewed patient's old medical records in [place]   HPI: Chloe Ramsey is a 86 y.o. female with medical history significant of hypertension, hyperlipidemia, CAD s/p stents, DM type II, COVID-19 positive on 10/07/2019, and dementia.   History is limited due to patient being verbal at this time.  She presents after being found to be acutely altered from her baseline.  Able to answer simple yes or no questions by shaking her head. Hospitalized 1/12 -1/16 diagnosed with acute respiratory failure with hypoxia secondary to COVID-19 with concurrent bacterial pneumonia and Klebsiella bacteremia.  She completed 5 days of steroids and remdesivir back on room air at discharge.  He had initially been placed on broad-spectrum antibiotics but it was narrowed down to ciprofloxacin patient complete 5 additional days at discharge to make a total of 10 days of antibiotics given.  Recommended at discharge for the patient to have repeat blood cultures once completed antibiotics.  ED Course: Upon admission to the emergency department patient was noted to be afebrile, respirations 22-30, O2 saturations maintained on 2 L of nasal cannula oxygen, and all other vital signs maintained.  CT scan of the brain without contrast did not show any acute abnormalities.  Labs significant for WBC 15.9, BUN 35, creatinine 1.18, glucose 236, and high sensitivity troponin 98.  Chest x-ray showed signs of a stable multifocal pneumonia.  Review of Systems  Unable to perform ROS: Patient nonverbal  Gastrointestinal: Positive for abdominal pain.  Psychiatric/Behavioral: Positive for memory loss.    Past Medical History:  Diagnosis Date  . CAD (coronary artery  disease)   . CHF (congestive heart failure) ([place])   . COVID-19   . Dementia ([place])   . Diabetes mellitus without complication ([place])   . H/O coronary angioplasty   . Hyperlipidemia   . Hypertension   . Peptic ulcer     No past surgical history on file.   reports that she has never smoked. She has never used smokeless tobacco. She reports previous alcohol use. She reports previous drug use.  Allergies  Allergen Reactions  . Lopid [Gemfibrozil]   . Penicillins     No family history on file.  Prior to Admission medications   Medication Sig Start Date End Date Taking? Authorizing Provider  acetaminophen (TYLENOL) 500 MG tablet Take 1,000 mg by mouth daily.    [provider]  amLODipine (NORVASC) 2.5 MG tablet Take 2.5 mg by mouth daily.    [provider]  apixaban (ELIQUIS) 2.5 MG TABS tablet Take 2.5 mg by mouth 2 (two) times daily. For 21 days, started 10-10-19    [provider]  atorvastatin (LIPITOR) 40 MG tablet Take 40 mg by mouth at bedtime.    [provider]  calcium citrate (CALCITRATE - DOSED IN MG ELEMENTAL CALCIUM) 950 (200 Ca) MG tablet Take 200 mg of elemental calcium by mouth daily.    [provider]  carvedilol (COREG) 12.5 MG tablet Take 12.5 mg by mouth 2 (two) times daily with a meal.    [provider]  Cholecalciferol (VITAMIN D) 50 MCG (2000 UT) CAPS Take 2,000 Units by mouth daily.    [provider]  donepezil (ARICEPT) 10 MG tablet Take 10 mg by mouth  at bedtime.    [provider]  furosemide (LASIX) 20 MG tablet Take 20 mg by mouth daily.    [provider]  haloperidol (HALDOL) 1 MG tablet Take 1 mg by mouth 3 (three) times daily.    [provider]  insulin aspart (NOVOLOG) 100 UNIT/ML injection 0-9 Units, Subcutaneous, 3 times daily with meals CBG < 70: Implement Hypoglycemia Standing Orders  CBG 70 - 120: 0 units CBG 121 - 150: 1 unit CBG 151 - 200: 2  units CBG 201 - 250: 3 units CBG 251 - 300: 5 units CBG 301 - 350: 7 units CBG 351 - 400: 9 units CBG > 400: call MD 10/31/19   Jonetta Osgood, MD  mirtazapine (REMERON) 7.5 MG tablet Take 7.5 mg by mouth at bedtime.    [provider]  tolterodine (DETROL LA) 4 MG 24 hr capsule Take 4 mg by mouth daily.    [provider]    Physical Exam:  Constitutional: Elderly female who appears to be in no acute distress at this time Vitals:   10/24/2019 1228 11/13/2019 1230 10/22/2019 1300 10/17/2019 1512  BP:  131/65 124/70   Pulse:  92 92 88  Resp:  (!) 25 (!) 23 (!) 22  Temp: 98.2 F (36.8 C)     TempSrc: Rectal     SpO2:  100% 100% 100%  Weight:      Height:       Eyes: PERRL, lids and conjunctivae normal ENMT: Mucous membranes are dry posterior pharynx clear of any exudate or lesions.   Neck: normal, supple, no masses, no thyromegaly.  No JVD. Respiratory: Tachypneic currently on 2 L nasal cannula oxygen with O2 saturations maintained Cardiovascular: Regular rate and rhythm, no murmurs / rubs / gallops. No lower extremity edema. 2+ pedal pulses. No carotid bruits.  Abdomen: Mild suprapubic tenderness, no masses palpated. No hepatosplenomegaly. Bowel sounds positive.  Musculoskeletal: no clubbing / cyanosis. No joint deformity upper and lower extremities. Good ROM, no contractures. Normal muscle tone.  Skin: no rashes, lesions, ulcers. No induration Neurologic: CN 2-12 grossly intact. Sensation intact, DTR normal. Strength 5/5 in all 4.  Psychiatric:   Alert, but nonverbal at this time.   Labs on Admission: I have personally reviewed following labs and imaging studies  CBC: Recent Labs  Lab 11/10/2019 1320  WBC 15.9*  NEUTROABS 13.9*  HGB 13.4  HCT 41.3  MCV 96.9  PLT 123XX123   Basic Metabolic Panel: Recent Labs  Lab 11/13/2019 1320  NA 138  K 5.1  CL 100  CO2 29  GLUCOSE 236*  BUN 35*  CREATININE 1.18*  CALCIUM 10.0   GFR: Estimated Creatinine  Clearance: 35.6 mL/min (A) (by C-G formula based on SCr of 1.18 mg/dL (H)). Liver Function Tests: Recent Labs  Lab 10/18/2019 1320  AST 30  ALT 17  ALKPHOS 83  BILITOT 1.1  PROT 5.2*  ALBUMIN 2.0*   No results for input(s): LIPASE, AMYLASE in the last 168 hours. No results for input(s): AMMONIA in the last 168 hours. Coagulation Profile: No results for input(s): INR, PROTIME in the last 168 hours. Cardiac Enzymes: No results for input(s): CKTOTAL, CKMB, CKMBINDEX, TROPONINI in the last 168 hours. BNP (last 3 results) No results for input(s): PROBNP in the last 8760 hours. HbA1C: No results for input(s): HGBA1C in the last 72 hours. CBG: No results for input(s): GLUCAP in the last 168 hours. Lipid Profile: No results for input(s): CHOL, HDL, LDLCALC,  TRIG, CHOLHDL, LDLDIRECT in the last 72 hours. Thyroid Function Tests: No results for input(s): TSH, T4TOTAL, FREET4, T3FREE, THYROIDAB in the last 72 hours. Anemia Panel: No results for input(s): VITAMINB12, FOLATE, FERRITIN, TIBC, IRON, RETICCTPCT in the last 72 hours. Urine analysis:    Component Value Date/Time   COLORURINE YELLOW 10/28/2019 1308   APPEARANCEUR HAZY (A) 10/28/2019 1308   LABSPEC 1.019 10/28/2019 1308   PHURINE 5.0 10/28/2019 1308   GLUCOSEU 150 (A) 10/28/2019 1308   HGBUR NEGATIVE 10/28/2019 1308   [place] 10/28/2019 1308   KETONESUR NEGATIVE 10/28/2019 1308   PROTEINUR NEGATIVE 10/28/2019 1308   NITRITE NEGATIVE 10/28/2019 1308   LEUKOCYTESUR MODERATE (A) 10/28/2019 1308   Sepsis Labs: No results found for this or any previous visit (from the past 240 hour(s)).   Radiological Exams on Admission: DG Chest 2 View  Result Date: 11/12/2019 CLINICAL DATA:  Altered mental status, emesis. EXAM: CHEST - 2 VIEW COMPARISON:  Chest x-rays dated 10/29/2019 and 10/27/2019. FINDINGS: Stable cardiomegaly. Aortic atherosclerosis. Patchy airspace opacities are again seen bilaterally, not significantly  changed. No new lung findings. No pleural effusion or pneumothorax is seen. Hiatal hernia is present, moderate to large in size. Kyphosis of the thoracic spine. No acute or suspicious osseous finding. IMPRESSION: No significant interval change. Patchy bilateral airspace opacities are again seen, LEFT greater than RIGHT, not significantly changed compared to the most recent chest x-ray of 10/28/2012, again suspicious for multifocal pneumonia. Electronically Signed   By: Franki Cabot M.D.   On: 11/02/2019 14:22   CT Head Wo Contrast  Result Date: 10/21/2019 CLINICAL DATA:  Altered mental status. EXAM: CT HEAD WITHOUT CONTRAST TECHNIQUE: Contiguous axial images were obtained from the base of the skull through the vertex without intravenous contrast. COMPARISON:  None. FINDINGS: Brain: Generalized age related parenchymal volume loss with commensurate dilatation of the ventricles and sulci. 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 Mild chronic small vessel ischemic changes within the bilateral periventricular white matter regions. No mass, hemorrhage, edema or other evidence of acute parenchymal abnormality. No extra-axial hemorrhage. Vascular: Chronic calcified atherosclerotic changes of the large vessels at the skull base. No unexpected hyperdense vessel. Skull: No acute findings. Sinuses/Orbits: No acute findings. Fixation hardware appears intact and appropriately positioned along the anterior walls of each maxillary sinus, overlying the nasal bones and overlying LEFT frontal sinus. Other: None IMPRESSION: 1. No acute findings. No intracranial mass, hemorrhage or edema. 2. 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 Additional chronic small vessel ischemic changes within the white matter. Electronically Signed   By: Franki Cabot M.D.   On: 11/06/2019 14:25    EKG: Independently reviewed.  Sinus rhythm 94 bpm with bifascicular  block minimal ST elevation anterolateral leads  Assessment/Plan Acute metabolic encephalopathy: Patient presents from nursing facility with reports of being acutely altered and not communicating like normal.  Patient appears to have no focal deficits on physical exam.  CT scan of the brain did not show any acute abnormalities.  Not totally clear the cause of patient's symptoms at this time but could be related with dehydration or infection. -Admit to a medical telemetry bed -Neurochecks  -Check blood, urine, and sputum culture -Follow-up MRI of the brain   Elevated troponin: Acute.  Initial troponin elevated at 98.  Dr. Doylene Canard cardiology consulted.  They feel patient is not a candidate for any cardiac interventions and recommend continuing therapy. -Continue to follow troponin -Appreciate  cardiology consultative services, follow-up for further recommendations  Dehydration: On admission patient found to have elevated BUN to creatinine ratio consistent with dehydration and poor p.o. intake. -Gentle IV fluids of normal saline at 75 mL/h  Urinary retention: Acute.  Patient was reported to have urinary retention. -In and out cath  only revealed 250 mL of urine.  She is on tolterodine which can cause urinary retention. -Hold tolterodine -Bladder scans every 6 hours -Check urinalysis  Diabetes mellitus type 2: On admission blood glucose elevated up to 236. -Check hemoglobin A1c in a.m. -Hypoglycemic protocol -CBGs before every meal with sensitive SSI  Recent pneumonia due to COVID-19: Patient reportedly tested positive for COVID-19 on 12/23 at [facility], but unable to confirm this.  Patient also tested positive on 1/12.  Patient should be out of the isolation. -Continue airborne precautions until can obtain records from [facility] regarding positive tested on the 23rd   Klebsiella pneumonia and bacteremia: Patient Klebsiella bacteremia and had been initially treated with vancomycin and  levofloxacin on 1/12, Meropenem on 1/12>>1/13, Azactam 1/13>>1/14, and then ciprofloxacin to complete to be completed on 1/22.  X-ray still shows multifocal unchanged from previous x-ray from 1/14. -Follow-up cultures  -Consider restarting antibiotics if needed  Diastolic dysfunction: Patient appears to be euvolemic at this time.  Last EF noted to be 60 to 65% with grade 2 echocardiogram from 1/12. -Strict intake and output -Daily weights  Essential hypertension -Continue Coreg  Dementia -Continue Aricept and Haldol  DNR present on admission   DVT prophylaxis:   Lovenox Code Status: DNR Family Communication: Son updated over the phone Disposition Plan: Likely discharge back to medical probe once medically stable Consults called: None Admission status: Inpatient  Norval Morton MD [facility] Pager [phone]   If 7PM-7AM, please contact night-coverage [email or web address] Password [facility]  10/20/2019, 3:18 PM

## 2019-11-08 ENCOUNTER — Inpatient Hospital Stay (HOSPITAL_COMMUNITY): Payer: Medicare HMO

## 2019-11-08 DIAGNOSIS — G9340 Encephalopathy, unspecified: Secondary | ICD-10-CM

## 2019-11-08 LAB — BASIC METABOLIC PANEL
Anion gap: 10 (ref 5–15)
BUN: 44 mg/dL — ABNORMAL HIGH (ref 8–23)
CO2: 28 mmol/L (ref 22–32)
Calcium: 9.8 mg/dL (ref 8.9–10.3)
Chloride: 100 mmol/L (ref 98–111)
Creatinine, Ser: 0.97 mg/dL (ref 0.44–1.00)
GFR calc Af Amer: >60 mL/min (ref >60)
GFR calc non Af Amer: 53 mL/min — ABNORMAL LOW (ref >60)
Glucose, Bld: 208 mg/dL — ABNORMAL HIGH (ref 70–99)
Potassium: 5 mmol/L (ref 3.5–5.1)
Sodium: 138 mmol/L (ref 135–145)

## 2019-11-08 LAB — CBC
HCT: 33.1 % — ABNORMAL LOW (ref 36.0–46.0)
Hemoglobin: 11.1 g/dL — ABNORMAL LOW (ref 12.0–15.0)
MCH: 31.9 pg (ref 26.0–34.0)
MCHC: 33.5 g/dL (ref 30.0–36.0)
MCV: 95.1 fL (ref 80.0–100.0)
Platelets: 270 10*3/uL (ref 150–400)
RBC: 3.48 MIL/uL — ABNORMAL LOW (ref 3.87–5.11)
RDW: 13.2 % (ref 11.5–15.5)
WBC: 16.9 10*3/uL — ABNORMAL HIGH (ref 4.0–10.5)
nRBC: 0 % (ref 0.0–0.2)

## 2019-11-08 LAB — PROTIME-INR
INR: 1.4 — ABNORMAL HIGH (ref 0.8–1.2)
Prothrombin Time: 16.9 seconds — ABNORMAL HIGH (ref 11.4–15.2)

## 2019-11-08 LAB — GLUCOSE, CAPILLARY
Glucose-Capillary: 161 mg/dL — ABNORMAL HIGH (ref 70–99)
Glucose-Capillary: 240 mg/dL — ABNORMAL HIGH (ref 70–99)
Glucose-Capillary: 202 mg/dL — ABNORMAL HIGH (ref 70–99)
Glucose-Capillary: 229 mg/dL — ABNORMAL HIGH (ref 70–99)

## 2019-11-08 LAB — CBC WITH DIFFERENTIAL/PLATELET
Abs Immature Granulocytes: 0.37 10*3/uL — ABNORMAL HIGH (ref 0.00–0.07)
Basophils Absolute: 0.1 10*3/uL (ref 0.0–0.1)
Basophils Relative: 0 %
Eosinophils Absolute: 0 10*3/uL (ref 0.0–0.5)
Eosinophils Relative: 0 %
HCT: 25.2 % — ABNORMAL LOW (ref 36.0–46.0)
Hemoglobin: 8.1 g/dL — ABNORMAL LOW (ref 12.0–15.0)
Immature Granulocytes: 1 %
Lymphocytes Relative: 5 %
Lymphs Abs: 1.3 10*3/uL (ref 0.7–4.0)
MCH: 31.8 pg (ref 26.0–34.0)
MCHC: 32.1 g/dL (ref 30.0–36.0)
MCV: 98.8 fL (ref 80.0–100.0)
Monocytes Absolute: 0.7 10*3/uL (ref 0.1–1.0)
Monocytes Relative: 3 %
Neutro Abs: 24.8 10*3/uL — ABNORMAL HIGH (ref 1.7–7.7)
Neutrophils Relative %: 91 %
Platelets: 290 10*3/uL (ref 150–400)
RBC: 2.55 MIL/uL — ABNORMAL LOW (ref 3.87–5.11)
RDW: 13.7 % (ref 11.5–15.5)
WBC: 27.3 10*3/uL — ABNORMAL HIGH (ref 4.0–10.5)
nRBC: 0 % (ref 0.0–0.2)

## 2019-11-08 LAB — PROCALCITONIN: Procalcitonin: 0.2 ng/mL

## 2019-11-08 LAB — TROPONIN I (HIGH SENSITIVITY): Troponin I (High Sensitivity): 152 ng/L (ref <18)

## 2019-11-08 MED ORDER — ASPIRIN 325 MG PO TABS
325.0000 mg | ORAL_TABLET | Freq: Every day | ORAL | Status: DC
  Administered 2019-11-08: 12:00:00 325 mg via ORAL
  Filled 2019-11-08: qty 1

## 2019-11-08 MED ORDER — ALBUTEROL SULFATE HFA 108 (90 BASE) MCG/ACT IN AERS
1.0000 | INHALATION_SPRAY | Freq: Three times a day (TID) | RESPIRATORY_TRACT | Status: DC
  Administered 2019-11-08: 2 via RESPIRATORY_TRACT
  Filled 2019-11-08: qty 6.7

## 2019-11-08 MED ORDER — HEPARIN BOLUS VIA INFUSION
5000.0000 [IU] | Freq: Once | INTRAVENOUS | Status: AC
  Administered 2019-11-08: 5000 [IU] via INTRAVENOUS
  Filled 2019-11-08: qty 5000

## 2019-11-08 MED ORDER — MORPHINE 100MG IN NS 100ML (1MG/ML) PREMIX INFUSION
5.0000 mg/h | INTRAVENOUS | Status: DC
  Administered 2019-11-08: 5 mg/h via INTRAVENOUS
  Filled 2019-11-08: qty 100

## 2019-11-08 MED ORDER — FUROSEMIDE 10 MG/ML IJ SOLN
20.0000 mg | Freq: Once | INTRAMUSCULAR | Status: AC
  Administered 2019-11-08: 20 mg via INTRAVENOUS
  Filled 2019-11-08: qty 2

## 2019-11-08 MED ORDER — SODIUM CHLORIDE 0.9 % IV SOLN
2.0000 g | Freq: Two times a day (BID) | INTRAVENOUS | Status: DC
  Administered 2019-11-08: 2 g via INTRAVENOUS
  Filled 2019-11-08: qty 2

## 2019-11-08 MED ORDER — SODIUM CHLORIDE 0.9 % IV SOLN
INTRAVENOUS | Status: DC
  Administered 2019-11-08: 900 mL via INTRAVENOUS

## 2019-11-08 MED ORDER — IOHEXOL 350 MG/ML SOLN
128.0000 mL | Freq: Once | INTRAVENOUS | Status: AC | PRN
  Administered 2019-11-08: 128 mL via INTRAVENOUS

## 2019-11-08 MED ORDER — HEPARIN (PORCINE) 25000 UT/250ML-% IV SOLN
1250.0000 [IU]/h | INTRAVENOUS | Status: DC
  Administered 2019-11-08: 1250 [IU]/h via INTRAVENOUS
  Filled 2019-11-08: qty 250

## 2019-11-08 MED ORDER — SODIUM CHLORIDE 0.9 % IV BOLUS: 250.0000 mL | Freq: Once | INTRAVENOUS | Status: DC

## 2019-11-09 DIAGNOSIS — J9601 Acute respiratory failure with hypoxia: Secondary | ICD-10-CM | POA: Diagnosis present

## 2019-11-09 DIAGNOSIS — I2699 Other pulmonary embolism without acute cor pulmonale: Secondary | ICD-10-CM | POA: Diagnosis present

## 2019-11-09 LAB — URINE CULTURE: Culture: NO GROWTH

## 2019-11-09 NOTE — Discharge Summary (Signed)
Death Summary  Chloe Ramsey [id number] DOB: 1930-07-07 DOA: 14-Nov-2019  PCP: Hilbert Corrigan, MD  Admit date: Nov 14, 2019 Date of Death: 2019/11/15  Final Diagnoses:  Principal Problem:  Acute hypoxic respiratory failure ([place]) Aspiration versus healthcare associated pneumonia Acute pulmonary embolism ([place]) Recent COVID-19 infection Acute on chronic systolic and diastolic CHF(HCC) Dementia with behavioral disturbances(HCC) Diabetes mellitus type 2 in nonobese (HCC) Klebsiella pneumonia, bacteremia Elevated troponin Demand ischemia Severe protein calorie malnutrition(HCC) Massive hiatal hernia   History of present illness:   [name] a 86 y.o.femalewith medical history significant ofDementia hypertension, hyperlipidemia,CAD s/p stents,DM type II,COVID-19 positive on 12/23. -History limited due to dementia and mild lethargy at this time -She was hospitalized 1/12-1/16 diagnosed withacute respiratory failure with hypoxia secondary to COVID-19 withconcurrent bacterial pneumonia and Klebsiella bacteremia. She completed 5 days of steroids and remdesivir, weaned off oxygen at discharge.   For her Klebsiella pneumonia/bacteremia she was originally placed on broad-spectrum antibiotics, this was narrowed down to ciprofloxacin patient complete 5 additional days at discharge to make a total of 10 days of antibiotics. -Patient is interactive, talkative and reportedly ambulatory, with dementia at baseline, sent to the ER November 13, 2022 with lethargy and worsening mental status, in the emergency room she was noted to be mildly tachypneic, mildly hypoxic, CT head was unremarkable, white count was 15.9, creatinine was 1.1, high-sensitivity troponin was 98, chest x-ray showed unchanged multifocal pneumonia, EKG was abnormal with concerns for minimal ST elevation   Hospital Course:  Patient with baseline dementia from SNF, recently treated for Covid pneumonia with concurrent Klebsiella  pneumonia and bacteremia, discharged a week ago was readmitted with lethargy on 1/13. -History could not be obtained given underlying dementia and worsening mental status -On admission her diagnosis was unclear, when I saw the patient she was lethargic with mild tachypnea and hypoxemia, chest x-ray was unchanged from recent baseline/Covid pneumonia -She did have mild ST elevation and slightly elevated troponins on admission, this was felt to be secondary to demand, she was seen by cardiology and not felt to be a candidate for any aggressive intervention. -Ordered a CT angiogram of the chest which was positive for acute pulmonary embolism, small pleural effusion, some concern for pneumonia and a massive hiatal hernia with most of the stomach sitting in the thoracic cavity, and stomach distended with fluid. -Immediately she was started on IV heparin, cefepime, given IV Lasix at a low dose due to soft blood pressures, unfortunately during the evening developed worsening respiratory distress into the night. -Subsequently overnight cross coverage was contacted, patient was noted to be deteriorating and after discussion with patient's son decision was made for comfort care and she expired   Time: 75min  Signed:  Domenic Polite  [facility] 11/09/2019, 2:22 PM

## 2019-11-12 LAB — CULTURE, BLOOD (ROUTINE X 2)
Culture: NO GROWTH
Culture: NO GROWTH
Special Requests: ADEQUATE

## 2019-11-16 NOTE — Progress Notes (Signed)
MEWS/VS Documentation      Nov 26, 2019 0556 2019-11-26 0559 11/26/2019 0601 Nov 26, 2019 0751   MEWS Score:  3  2  1  3    MEWS Score Color:  Yellow  Yellow  Green  Yellow   Resp:  (!) 27  (!) 21  --  (!) 27   Pulse:  (!) 108  --  99  (!) 105   BP:  --  --  --  (!) 110/53   Temp:  99.3 F (37.4 C)  --  --  97.9 F (36.6 C)   O2 Device:  Nasal Cannula  --  --  Nasal Cannula   O2 Flow Rate (L/min):  2 L/min  --  --  2 L/min   Level of Consciousness:  --  --  --  Alert    Patient's RR is elevated; no acute change. MD aware. Will continue to monitor.

## 2019-11-16 NOTE — Progress Notes (Signed)
   11-19-19 2029  MEWS Score  BP (!) 77/48  Pulse Rate (!) 119  ECG Heart Rate (!) 120  Resp (!) 52  SpO2 95 %  O2 Device Nasal Cannula  Patient Activity (if Appropriate) In bed  O2 Flow Rate (L/min) 2 L/min  MEWS Score  MEWS Temp 0  MEWS Systolic 2  MEWS Pulse 2  MEWS RR 3  MEWS LOC 1  MEWS Score 8  MEWS Score Color Red  MEWS Assessment  Is this an acute change? No  Rapid Response Notification  Name of Rapid Response RN Notified Mindy RN  Date Rapid Response Notified 11/19/19  Time Rapid Response Notified 2034  Provider Notification  Provider Name/Title Kennon Holter, NP  Date Provider Notified 2019/11/19  Time Provider Notified 2035  Notification Type Page  Notification Reason Other (Comment) (MEWS 7)  Response See new orders  Date of Provider Response November 19, 2019  Time of Provider Response 2037

## 2019-11-16 NOTE — Progress Notes (Signed)
Patient back from CT. Alert and oriented to self. RR in 56s. Will continue to monitor.

## 2019-11-16 NOTE — Progress Notes (Signed)
PROGRESS NOTE    Malina Stilley  [id number] DOB: 1929/12/14 DOA: 11/01/2019 PCP: Hilbert Corrigan, MD  Brief Narrative: Mesa Mehrhoff is a 86 y.o. female with medical history significant of Dementia hypertension, hyperlipidemia, CAD s/p stents, DM type II, COVID-19 positive on 12/23. -History limited due to dementia and mild lethargy at this time -She was hospitalized 1/12 -1/16 diagnosed with acute respiratory failure with hypoxia secondary to COVID-19 with concurrent bacterial pneumonia and Klebsiella bacteremia.  She completed 5 days of steroids and remdesivir, weaned off oxygen at discharge.    For her Klebsiella pneumonia/bacteremia she was originally placed on broad-spectrum antibiotics, this was narrowed down to ciprofloxacin patient complete 5 additional days at discharge to make a total of 10 days of antibiotics. -Patient is interactive, talkative and reportedly ambulatory, with dementia at baseline, sent to the ER yesterday with lethargy and worsening mental status, in the emergency room she was noted to be mildly tachypneic, mildly hypoxic, CT head was unremarkable, white count was 15.9, creatinine was 1.1, high-sensitivity troponin was 98, chest x-ray showed unchanged multifocal pneumonia, EKG was abnormal with concerns for minimal ST elevation  Assessment & Plan:   Metabolic encephalopathy -At baseline has dementia however is interactive and talkative -Admitted with lethargy x2 to 3 days on 1/23 from SNF -Exam is nonfocal, CT head and MRI without acute findings -Urinalysis unremarkable, chest x-ray is unchanged from recent Covid pneumonia -High-sensitivity troponin mildly elevated, will repeat, seen by cardiology not a candidate for aggressive therapy -Mild tachypnea noted as well, check CT angiogram chest to rule out PE -Check procalcitonin -Follow-up blood cultures -Gentle IV fluids today  SIRS with mild hypoxia -No obvious evidence of infection at this time, abdominal exam  is unremarkable, LFTs benign, chest x-ray and urinalysis unrevealing -Follow-up blood cultures -Check procalcitonin, monitor -Follow-up CTA chest  Abnormal EKG, elevated high-sensitivity troponin History of CAD, multiple stents -EKG with minimal ST elevation, patient denies any chest pain or dyspnea, however history is limited with dementia -Seen by cardiology Dr. Doylene Canard, patient is not felt to be a candidate for cardiac interventions -Discussed with son patient has CAD with 4 stents, last one being 5 years ago -Add aspirin, continue carvedilol, atorvastatin -Follow-up CTA chest, recent echo from 10 days ago with preserved EF  Urinary retention -Recurrent, status post In-N-Out cath x2 -Insert Foley catheter if retention persists  Recent COVID-19 pneumonia -Originally tested positive for COVID-19 on 12/23, also tested positive on 1/13 -At this point patient has completed isolation. -Chest x-ray with residual changes from Covid PNA   Chronic diastolic CHF (congestive heart failure) ([place]) -Appears slightly dry at this time, continue gentle IV fluids today, monitor for fluid overload  Recent Klebsiella pneumonia with bacteremia -Completed 10-day course of antibiotics for this  Dementia  Severe protein calorie malnutrition  DVT prophylaxis: Lovenox Code Status: DNR Family Communication: No family at bedside, called and discussed with son Yazlin Goodstein Disposition Plan: To be determined pending improvement in mental status, SIRS  Consultants:   Cardiology Dr. Doylene Canard   Procedures:   Antimicrobials:    Subjective: -No events overnight, respiratory rate and heart rate slightly abnormal  Objective: Vitals:   December 05, 2019 0559 Dec 05, 2019 0601 2019/12/05 0751 2019-12-05 1000  BP:   (!) 110/53   Pulse:  99 (!) 105   Resp: (!) 21  (!) 27 (!) 28  Temp:   97.9 F (36.6 C)   TempSrc:   Oral   SpO2: 99%  98% 95%  Weight:  Height:        Intake/Output Summary (Last 24 hours)  at December 01, 2019 1120 Last data filed at 12/01/2019 1118 Gross per 24 hour  Intake 1717.2 ml  Output 600 ml  Net 1117.2 ml   Filed Weights   10/17/2019 1220  Weight: 79 kg    Examination:  General exam: Elderly frail chronically ill-appearing female, sitting up in bed, somnolent but arousable, oriented to self only, answers few questions appropriately Respiratory system: Diminished breath sounds the bases, otherwise clear  cardiovascular system: S1 & S2 heard, RRR.  Gastrointestinal system: Abdomen is nondistended, soft and nontender.Normal bowel sounds heard. Central nervous system: Alert and oriented. No focal neurological deficits. Extremities: No edema Skin: No rashes Psychiatry: Poor insight and judgment    Data Reviewed:   CBC: Recent Labs  Lab 10/21/2019 1320 12-01-19 0406  WBC 15.9* 16.9*  NEUTROABS 13.9*  --   HGB 13.4 11.1*  HCT 41.3 33.1*  MCV 96.9 95.1  PLT 251 [id number]   Basic Metabolic Panel: Recent Labs  Lab 10/26/2019 1320 December 01, 2019 0406  NA 138 138  K 5.1 5.0  CL 100 100  CO2 29 28  GLUCOSE 236* 208*  BUN 35* 44*  CREATININE 1.18* 0.97  CALCIUM 10.0 9.8   GFR: Estimated Creatinine Clearance: 43.3 mL/min (by C-G formula based on SCr of 0.97 mg/dL). Liver Function Tests: Recent Labs  Lab 10/31/2019 1320  AST 30  ALT 17  ALKPHOS 83  BILITOT 1.1  PROT 5.2*  ALBUMIN 2.0*   No results for input(s): LIPASE, AMYLASE in the last 168 hours. No results for input(s): AMMONIA in the last 168 hours. Coagulation Profile: No results for input(s): INR, PROTIME in the last 168 hours. Cardiac Enzymes: No results for input(s): CKTOTAL, CKMB, CKMBINDEX, TROPONINI in the last 168 hours. BNP (last 3 results) No results for input(s): PROBNP in the last 8760 hours. HbA1C: Recent Labs    11/06/2019 1750  HGBA1C 8.6*   CBG: Recent Labs  Lab 01-Dec-2019 0807  GLUCAP 161*   Lipid Profile: No results for input(s): CHOL, HDL, LDLCALC, TRIG, CHOLHDL, LDLDIRECT in the  last 72 hours. Thyroid Function Tests: No results for input(s): TSH, T4TOTAL, FREET4, T3FREE, THYROIDAB in the last 72 hours. Anemia Panel: No results for input(s): VITAMINB12, FOLATE, FERRITIN, TIBC, IRON, RETICCTPCT in the last 72 hours. Urine analysis:    Component Value Date/Time   COLORURINE YELLOW 10/16/2019 1639   APPEARANCEUR CLEAR 10/20/2019 1639   LABSPEC 1.023 10/30/2019 1639   PHURINE 5.0 11/02/2019 1639   GLUCOSEU 50 (A) 10/21/2019 1639   HGBUR NEGATIVE 10/27/2019 [place] 10/31/2019 [place] 11/04/2019 1639   PROTEINUR 30 (A) 11/10/2019 1639   NITRITE NEGATIVE 10/30/2019 1639   LEUKOCYTESUR NEGATIVE 10/26/2019 1639   Sepsis Labs: @LABRCNTIP (procalcitonin:4,lacticidven:4)  ) Recent Results (from the past 240 hour(s))  Blood culture (routine x 2)     Status: None (Preliminary result)   Collection Time: 11/07/19  1:20 PM   Specimen: BLOOD  Result Value Ref Range Status   Specimen Description BLOOD RIGHT ANTECUBITAL  Final   Special Requests   Final    BOTTLES DRAWN AEROBIC AND ANAEROBIC Blood Culture results may not be optimal due to an inadequate volume of blood received in culture bottles   Culture   Final    NO GROWTH < 24 HOURS Performed at [facility] [address] [place], [place] [address]    Report Status PENDING  Incomplete  Blood culture (routine x 2)     Status: None (Preliminary result)   Collection Time: 11/10/2019  1:22 PM   Specimen: BLOOD LEFT ARM  Result Value Ref Range Status   Specimen Description BLOOD LEFT ARM  Final   Special Requests   Final    BOTTLES DRAWN AEROBIC AND ANAEROBIC Blood Culture adequate volume   Culture   Final    NO GROWTH < 24 HOURS Performed at [facility], [place] [address] [place], [place] [address]    Report Status PENDING  Incomplete  MRSA PCR Screening     Status: None   Collection Time: 10/25/2019  5:53 PM   Specimen: Nasopharyngeal  Result Value Ref Range  Status   MRSA by PCR NEGATIVE NEGATIVE Final    Comment:        The GeneXpert MRSA Assay (FDA approved for NASAL specimens only), is one component of a comprehensive MRSA colonization surveillance program. It is not intended to diagnose MRSA infection nor to guide or monitor treatment for MRSA infections. Performed at [facility], [place] [address], [place], [place] [address]          Radiology Studies: DG Chest 2 View  Result Date: 11/13/2019 CLINICAL DATA:  Altered mental status, emesis. EXAM: CHEST - 2 VIEW COMPARISON:  Chest x-rays dated 10/29/2019 and 10/27/2019. FINDINGS: Stable cardiomegaly. Aortic atherosclerosis. Patchy airspace opacities are again seen bilaterally, not significantly changed. No new lung findings. No pleural effusion or pneumothorax is seen. Hiatal hernia is present, moderate to large in size. Kyphosis of the thoracic spine. No acute or suspicious osseous finding. IMPRESSION: No significant interval change. Patchy bilateral airspace opacities are again seen, LEFT greater than RIGHT, not significantly changed compared to the most recent chest x-ray of 10/28/2012, again suspicious for multifocal pneumonia. Electronically Signed   By: Franki Cabot M.D.   On: 11/13/2019 14:22   CT Head Wo Contrast  Result Date: 11/12/2019 CLINICAL DATA:  Altered mental status. EXAM: CT HEAD WITHOUT CONTRAST TECHNIQUE: Contiguous axial images were obtained from the base of the skull through the vertex without intravenous contrast. COMPARISON:  None. FINDINGS: Brain: Generalized age related parenchymal volume loss with commensurate dilatation of the ventricles and sulci. 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 Mild chronic small vessel ischemic changes within the bilateral periventricular white matter regions. No mass, hemorrhage, edema or other evidence of acute parenchymal abnormality. No extra-axial hemorrhage. Vascular:  Chronic calcified atherosclerotic changes of the large vessels at the skull base. No unexpected hyperdense vessel. Skull: No acute findings. Sinuses/Orbits: No acute findings. Fixation hardware appears intact and appropriately positioned along the anterior walls of each maxillary sinus, overlying the nasal bones and overlying LEFT frontal sinus. Other: None IMPRESSION: 1. No acute findings. No intracranial mass, hemorrhage or edema. 2. 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 Additional chronic small vessel ischemic changes within the white matter. Electronically Signed   By: Franki Cabot M.D.   On: 11/09/2019 14:25   MR BRAIN WO CONTRAST  Result Date: 10/21/2019 CLINICAL DATA:  Encephalopathy. Additional history provided: Acute metabolic encephalopathy, patient presents from nursing facility with reports of being acutely altered and non communicative, COVID (+) EXAM: MRI HEAD WITHOUT CONTRAST TECHNIQUE: Multiplanar, multiecho pulse sequences of the brain and surrounding structures were obtained without intravenous contrast. COMPARISON:  Head CT performed earlier the same day 11/04/2019 FINDINGS: Brain: There is moderate motion degradation of the coronal diffusion-weighted  imaging. However, the axial diffusion-weighted imaging is of good quality and there is no evidence of acute infarct. The remainder of the examination is significantly motion degraded and limited. This includes moderate/severe motion degradation of the sagittal T1 weighted sequence, severe motion degradation of the axial T2 weighted sequence, moderate motion degradation of the axial T2 FLAIR sequence, moderate motion degradation of the axial T2* sequence, moderate motion degradation of the axial T1 weighted sequence and moderate/severe motion degradation of the coronal T2 weighted sequence. Chronic cortical/subcortical infarcts are again demonstrated within the right frontal lobe and left parietal lobe.  Additional mild scattered T2/FLAIR hyperintensity within the cerebral white matter is nonspecific, but consistent with chronic small vessel ischemic disease. No intracranial mass is identified. No midline shift. No extra-axial fluid collection is identified. No evidence of chronic intracranial blood products. Moderate/advanced generalized parenchymal atrophy. Vascular: Flow voids poorly assessed due to the degree of motion degradation. Skull and upper cervical spine: No focal marrow lesion identified within the limitations of motion degradation. Sinuses/Orbits: No orbital abnormality identified on motion degraded imaging. Partial T2 hyperintense opacification of the left frontal sinus. No significant mastoid effusion. IMPRESSION: The axial diffusion-weighted imaging is of good quality. No evidence of acute infarct. The remainder of the examination is markedly motion degraded and limited. Within this limitation, no acute intracranial abnormality is identified. 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 Mild chronic small vessel ischemic disease. Moderate/advanced generalized parenchymal atrophy. Left frontal sinusitis. Electronically Signed   By: Kellie Simmering DO   On: 10/30/2019 20:44        Scheduled Meds: . albuterol  2.5 mg Nebulization TID  . atorvastatin  40 mg Oral QHS  . carvedilol  12.5 mg Oral BID  . donepezil  10 mg Oral QHS  . enoxaparin (LOVENOX) injection  40 mg Subcutaneous Q24H  . guaiFENesin  600 mg Oral BID  . insulin aspart  0-5 Units Subcutaneous QHS  . insulin aspart  0-9 Units Subcutaneous TID WC  . midazolam  4 mg Intravenous Once  . mirtazapine  7.5 mg Oral QHS   Continuous Infusions: . sodium chloride       LOS: 1 day    Time spent: 16min  Domenic Polite, MD [facility]  11/17/2019, 11:20 AM

## 2019-11-16 NOTE — Progress Notes (Signed)
ANTICOAGULATION & ANTIMICROBIAL CONSULT NOTE - Initial Consult  Pharmacy Consult for heparin Indication: pulmonary embolus  Allergies  Allergen Reactions  . Lopid [Gemfibrozil] Other (See Comments)    Unknown reaction  . Penicillins Other (See Comments)    Unknown reaction Did it involve swelling of the face/tongue/throat, SOB, or low BP? Unknown Did it involve sudden or severe rash/hives, skin peeling, or any reaction on the inside of your mouth or nose? Unknown Did you need to seek medical attention at a hospital or doctor's office? Unknown When did it last happen?unknown If all above answers are "NO", may proceed with cephalosporin use.    Patient Measurements: Height: 5\' 6"  (167.6 cm) Weight: 174 lb 2.6 oz (79 kg) IBW/kg (Calculated) : 59.3 Heparin Dosing Weight: 75.6 kg   Vital Signs: Temp: 98.9 F (37.2 C) (01/24 1337) Temp Source: Oral (01/24 1337) BP: 102/45 (01/24 1337) Pulse Rate: 110 (01/24 1337)  Labs: Recent Labs    11/02/2019 1320 11/05/2019 1533 2019/12/07 0406 12/07/2019 1143  HGB 13.4  --  11.1*  --   HCT 41.3  --  33.1*  --   PLT 251  --  270  --   CREATININE 1.18*  --  0.97  --   TROPONINIHS 98* 116*  --  152*    Estimated Creatinine Clearance: 43.3 mL/min (by C-G formula based on SCr of 0.97 mg/dL).   Medical History: Past Medical History:  Diagnosis Date  . CAD (coronary artery disease)   . CHF (congestive heart failure) ([place])   . COVID-19   . Dementia ([place])   . Diabetes mellitus without complication ([place])   . H/O coronary angioplasty   . Hyperlipidemia   . Hypertension   . Peptic ulcer     Assessment: 86 yo female presented on 11/02/2019 with AMS. Of note, patient tested positive for COVID-19 on 12/23 and patient was recently hospitalized from 1/12-1/16 with respiratory failure with hypoxia secondary to COVID-19 with bacterial PNA and klebsiella bacteremia. Pharmacy consulted to dose heparin for PE. CTA found acute PE in segmental and  subsegmental pulmonary arteries to left upper lobe and subsegmental pulmonary arteries to right lower lobe. D-dimer 0.64. Patient is not on anticoagulation prior to admission. Hgb 11.1 Plt 270. No reported bleeding.   Pharmacy also consulted to dose cefepime for HAP. WBC 16.9. Lactic acid 1.5. Afebrile. Patient has a PCN allergy that is not specified. Patient has not received cephalosporins in our system and no documentation in Care Everywhere. Patient did not answer her phone to speak with pharmacy regarding allergy. Per RN patient has severe dementia and will be unable to explain reaction. Confirmed with Dr. Broadus John that will continue with cefepime as cross-reactivity with cefepime is low and unclear if true PCN allergy.   Goal of Therapy:  Heparin level 0.3-0.7 units/ml Monitor platelets by anticoagulation protocol: Yes   Plan:  Heparin 5000 units x1 bolus  Start heparin 1250 units/hr  Check heparin level at 2300  Monitor heparin level, CBC, and S/S of bleeding  Follow up transition to oral anticoagulant  Start cefepime 2g IV q12h  Monitor closely for signs of allergic reaction (RN aware)  Monitor renal function, cultures/sensitives, and clinical progression   Cristela Felt, PharmD PGY1 Pharmacy Resident [employer]: [phone]  Dec 07, 2019,2:01 PM

## 2019-11-16 NOTE — Progress Notes (Signed)
MD was informed that patient's RR is going up in low 40s and HR 117.  Per order, patient will receive Lasix 20 mg IV. Will continue to monitor.

## 2019-11-16 NOTE — Progress Notes (Signed)
Patient going to CT

## 2019-11-16 NOTE — Progress Notes (Signed)
This nurse and Carrie Mew, RN wasted 41mL IV Morphine via stericycle post-mortem.

## 2019-11-16 NOTE — Progress Notes (Addendum)
Bladder scan - 59 ml. Will continue to monitor.

## 2019-11-16 NOTE — Progress Notes (Signed)
Bladder scan - 146 ml; after bladder scan patient voided (incontinent). Will continue to monitor.

## 2019-11-16 NOTE — Progress Notes (Signed)
   11-19-19 1914  MEWS Score  BP (!) 95/51  Pulse Rate (!) 113  ECG Heart Rate (!) 113  Resp (!) 49  SpO2 94 %  O2 Device Nasal Cannula  Patient Activity (if Appropriate) In bed  O2 Flow Rate (L/min) 2 L/min  MEWS Score  MEWS Temp 0  MEWS Systolic 1  MEWS Pulse 2  MEWS RR 3  MEWS LOC 0  MEWS Score 6  MEWS Score Color Red  MEWS Assessment  Is this an acute change? No  Miguel Christiana Notification  Levent Kornegay Name/Title Kennon Holter, NP  Date Sydnee Lamour Notified 11/19/2019  Time Law Corsino Notified 1943  Notification Type Page  Notification Reason Other (Comment) (SBP [id number].AP:[id number].R 30-49.Pt. in bed drowsy,pale.)  Response No new orders   Will continue to monitor.

## 2019-11-16 NOTE — Significant Event (Signed)
Rapid Response Event Note  Overview: Originally called at 1851 for continued tachpnea and high MEWS. RRT was asked to see pt on rounds.  Time Called: 1851(Asked to see pt during rounds) Arrival Time: 2030    Initial Focused Assessment: Pt laying in bed with eyes closed, will respond to painful stimuli and nod/shake her head appropriately to some simple questions. Pt taking fast, shallow breaths. Lungs diminished t/o. Skin cool to touch. T-98.3, HR-116, BP-85/48, RR-50, SpO2-93% on 2L [place].  Interventions: Family updated by [facility] NP and transitioned to comfort care.  Plan of Care (if not transferred): Comfort care.  Event Summary: Name of Physician Notified: Bl[place], NP at 2038    at    Outcome: Stayed in room and stabalized(pt made comfort care)  Event End Time: 2045  Dillard Essex

## 2019-11-16 NOTE — Progress Notes (Addendum)
Rapid Response nurse was informed about patient's condition; the nurse will come to see the patient.

## 2019-11-16 NOTE — Progress Notes (Signed)
Patient's son, Delfino Lovett was called and updated with his mother's condition. All questions were answered. Will continue to monitor.

## 2019-11-16 NOTE — Progress Notes (Signed)
MD made aware  That patient's RR is in 33s. Will continue to monitor.

## 2019-11-16 NOTE — Progress Notes (Addendum)
MD was informed about patient's VS: BP 86/41; RR 35; Sat O2 93% on 3L [place]; HR 110. BP Manual 92/51. Patient alert, resting in bed. Per MD will continue to monitor. Will continue to monitor.

## 2019-11-16 NOTE — Progress Notes (Signed)
Spoke with bedside RN with concerns about pt increased MEWS score and poor prognosis. A phone call was made to the son regarding his mother's condition and the decision was made to make patient comfort care at this time.   Lovey Newcomer, NP [facility] [phone]-[phone] [phone]

## 2019-11-16 DEATH — deceased

## 2021-02-28 IMAGING — MR MR HEAD W/O CM
10 of 11 series · 35 of 48 positions shown · non-contrast
Comparison: Head CT performed earlier the same day 11/07/2019

CLINICAL DATA: Encephalopathy. Additional history provided: Acute
metabolic encephalopathy, patient presents from nursing facility
with reports of being acutely altered and non communicative, COVID
(+)

EXAM:
MRI HEAD WITHOUT CONTRAST
TECHNIQUE: Multiplanar, multiecho pulse sequences of the brain and surrounding
structures were obtained without intravenous contrast.

[Series 3: DWI · axial · 3.0mm · 1.09mm/px · z∈[-59,+81]mm · 8 of 96 slices shown (1 of 4)]
[im 1/96]
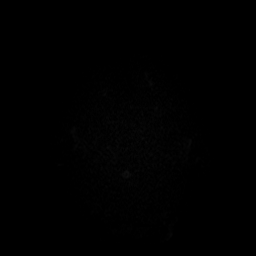
[im 11/96]
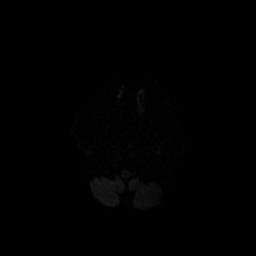
[im 32/96]
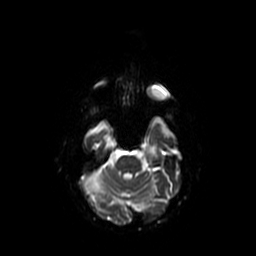
[im 43/96]
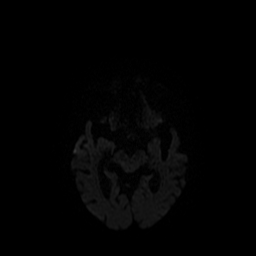
[im 53/96]
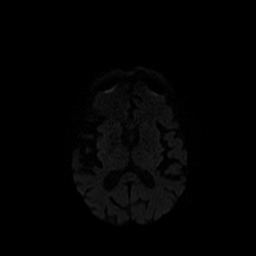
[im 64/96]
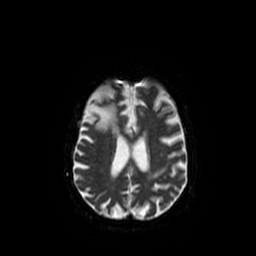
[im 85/96]
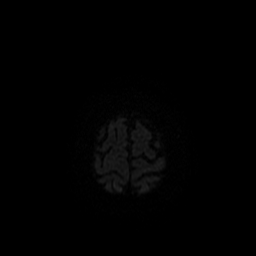
[im 96/96]
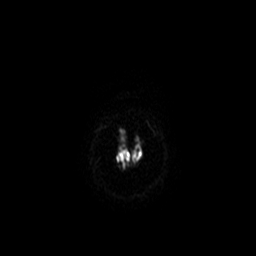

[Series 4: DWI · coronal · 5.0mm · 1.09mm/px · 8 of 68 slices shown (2 of 4)]
[im 1/68]
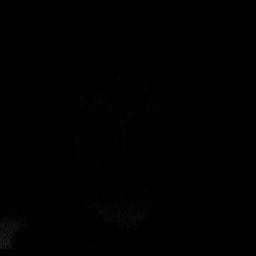
[im 10/68]
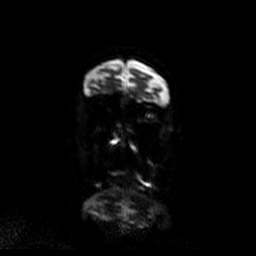
[im 20/68]
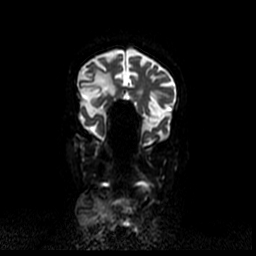
[im 29/68]
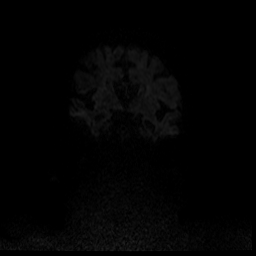
[im 39/68]
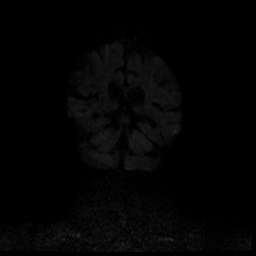
[im 48/68]
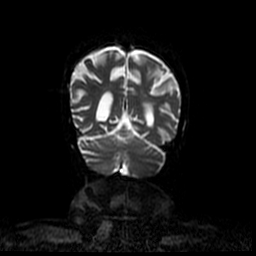
[im 58/68]
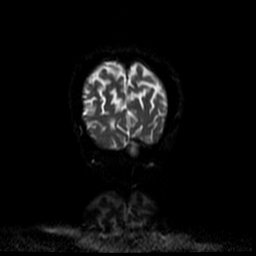
[im 68/68]
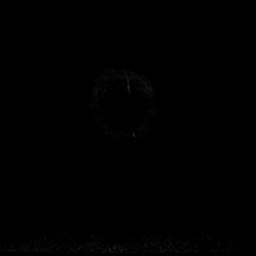

[Series 5: T1 · sagittal · 5.0mm · 0.47mm/px · 2 of 23 slices shown (1 of 2)]
[im 1/23]
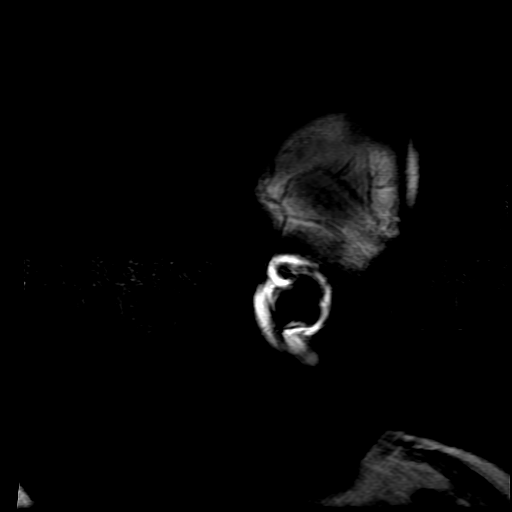
[im 23/23]
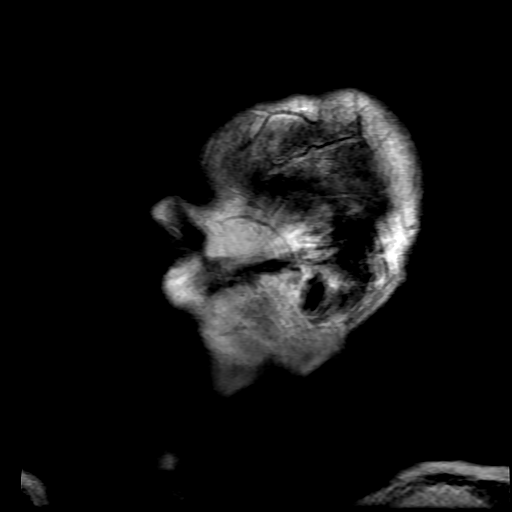

[Series 6: T2 · axial · 5.0mm · 0.43mm/px · z∈[-54,+96]mm · 2 of 26 slices shown (1 of 3)]
[im 1/26]
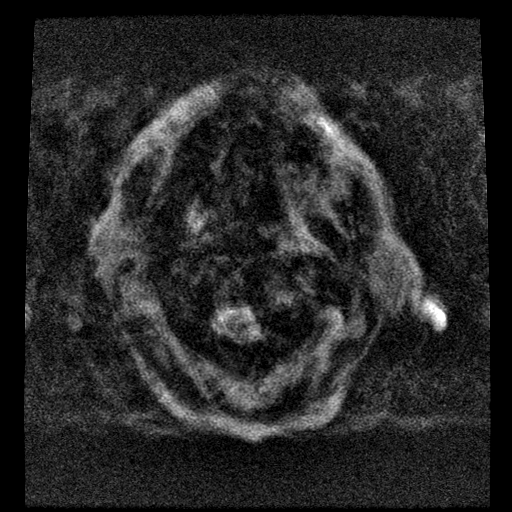
[im 26/26]
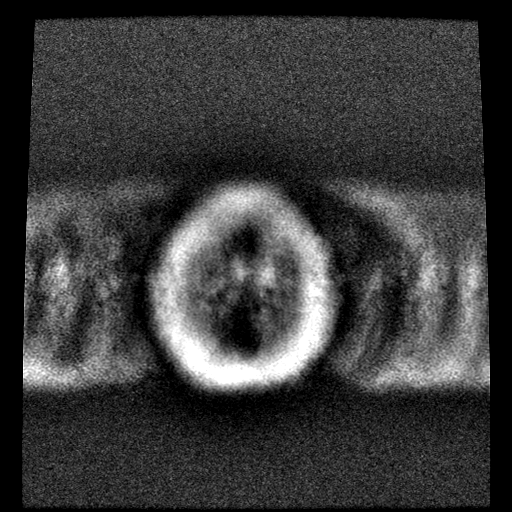

[Series 7: FLAIR · axial · 3.0mm · 0.43mm/px · z∈[-54,+96]mm · 2 of 26 slices shown]
[im 1/26]
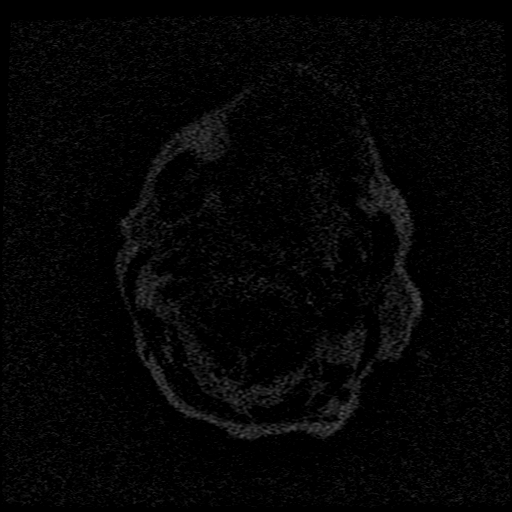
[im 26/26]
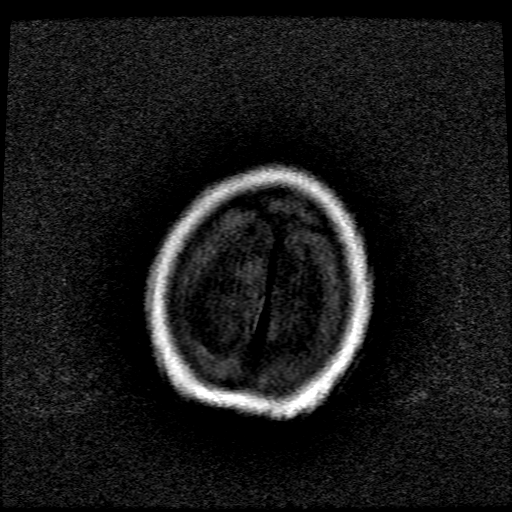

[Series 9: T1 · axial · 3.0mm · 0.47mm/px · 1 of 104 slices shown (2 of 2)]
[im 1/104]
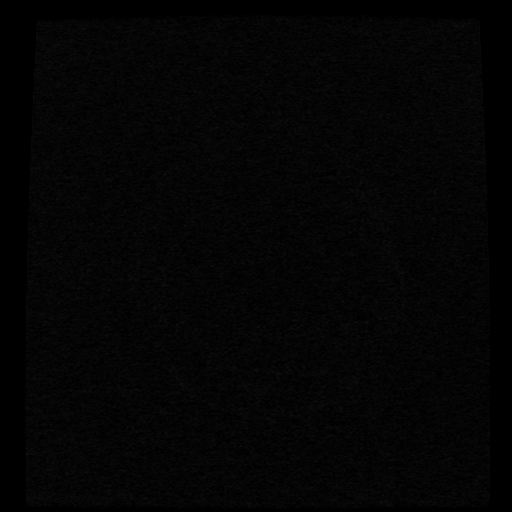

[Series 10: T2 · axial · 5.0mm · 0.43mm/px · z∈[-54,+96]mm · 2 of 26 slices shown (2 of 3)]
[im 1/26]
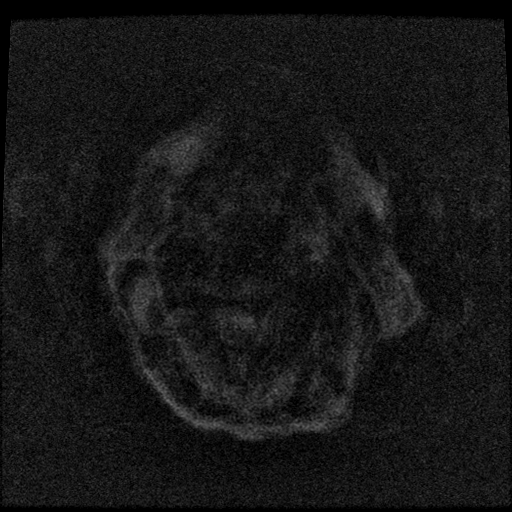
[im 26/26]
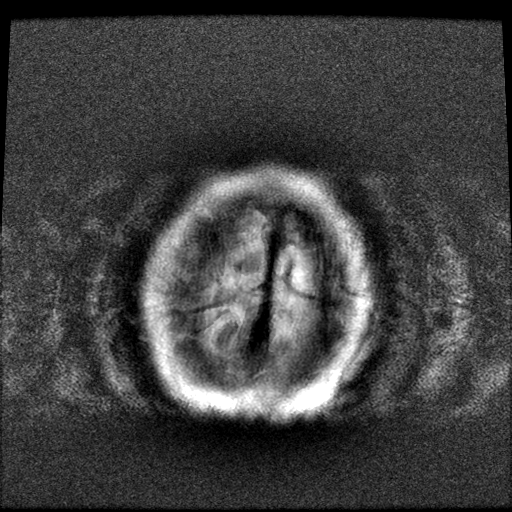

[Series 11: T2 · coronal · 5.0mm · 0.39mm/px · 2 of 26 slices shown (3 of 3)]
[im 1/26]
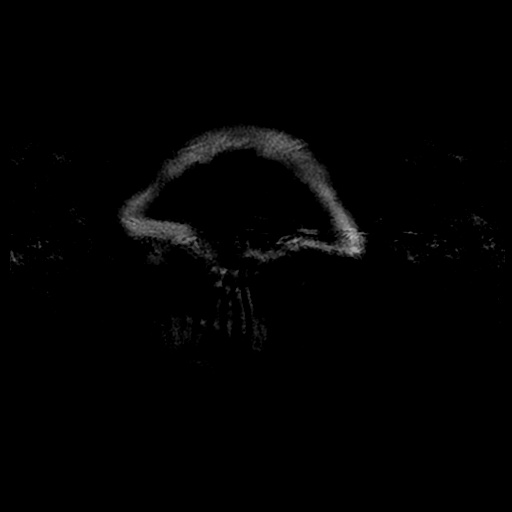
[im 26/26]
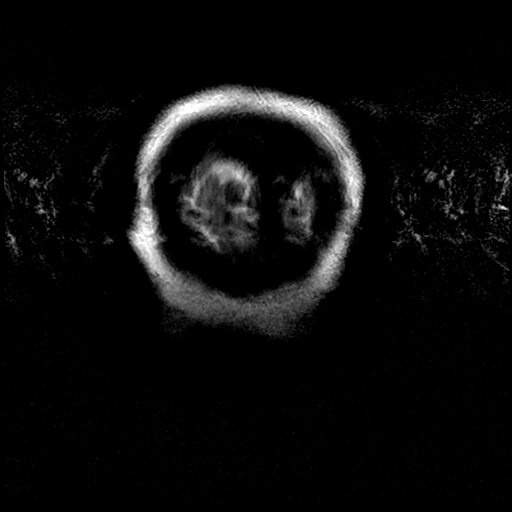

[Series 300: DWI · axial · 3.0mm · 1.09mm/px · z∈[-59,+81]mm · 5 of 48 slices shown (3 of 4)]
[im 1/48]
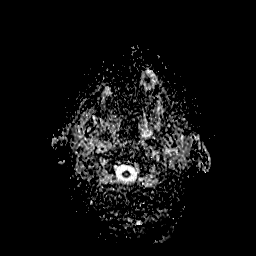
[im 12/48]
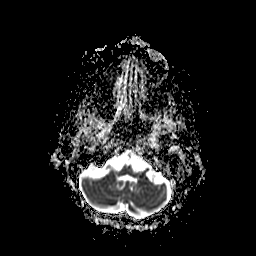
[im 24/48]
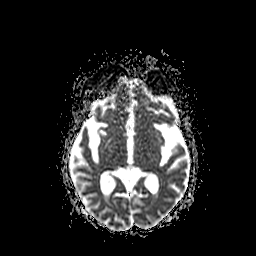
[im 36/48]
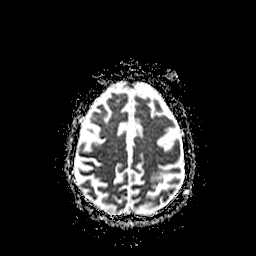
[im 48/48]
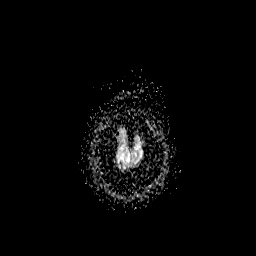

[Series 400: DWI · coronal · 5.0mm · 1.09mm/px · 3 of 31 slices shown (4 of 4)]
[im 1/31]
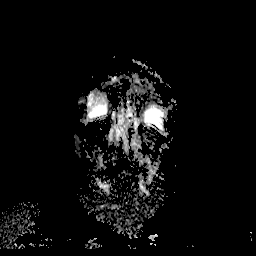
[im 16/31]
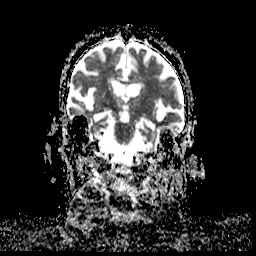
[im 31/31]
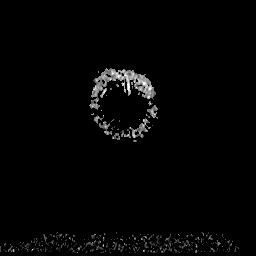

[35 of 48 positions shown; findings below may reference images not displayed]

FINDINGS: Brain:

There is moderate motion degradation of the coronal
diffusion-weighted imaging. However, the axial diffusion-weighted
imaging is of good quality and there is no evidence of acute
infarct.

The remainder of the examination is significantly motion degraded
and limited. This includes moderate/severe motion degradation of the
sagittal T1 weighted sequence, severe motion degradation of the
axial T2 weighted sequence, moderate motion degradation of the axial
T2 FLAIR sequence, moderate motion degradation of the axial T2*
sequence, moderate motion degradation of the axial T1 weighted
sequence and moderate/severe motion degradation of the coronal T2
weighted sequence.

Chronic cortical/subcortical infarcts are again demonstrated within
the right frontal lobe and left parietal lobe. Additional mild
scattered T2/FLAIR hyperintensity within the cerebral white matter
is nonspecific, but consistent with chronic small vessel ischemic
disease. No intracranial mass is identified. No midline shift. No
extra-axial fluid collection is identified. No evidence of chronic
intracranial blood products. Moderate/advanced generalized
parenchymal atrophy.

Vascular: Flow voids poorly assessed due to the degree of motion
degradation.

Skull and upper cervical spine: No focal marrow lesion identified
within the limitations of motion degradation.

Sinuses/Orbits: No orbital abnormality identified on motion degraded
imaging. Partial T2 hyperintense opacification of the left frontal
sinus. No significant mastoid effusion.
IMPRESSION: The axial diffusion-weighted imaging is of good quality. No evidence
of acute infarct.

The remainder of the examination is markedly motion degraded and
limited. Within this limitation, no acute intracranial abnormality
is identified.

Chronic cortical/subcortical right frontal and left parietal lobe
infarcts.

Mild chronic small vessel ischemic disease.

Moderate/advanced generalized parenchymal atrophy.

Left frontal sinusitis.
